# Patient Record
Sex: Male | Born: 1965
Health system: Southern US, Community
[De-identification: ages and names within clinical notes are randomized; demographics above are authoritative.]

## PROBLEM LIST (undated history)

## (undated) DIAGNOSIS — E785 Hyperlipidemia, unspecified: Secondary | ICD-10-CM

## (undated) HISTORY — DX: Hyperlipidemia, unspecified: E78.5

---

## 2003-02-21 HISTORY — PX: APPENDECTOMY: SHX54

## 2003-02-21 HISTORY — PX: HERNIA REPAIR: SHX51

## 2003-06-10 ENCOUNTER — Observation Stay (HOSPITAL_COMMUNITY): Admission: EM | Admit: 2003-06-10 | Discharge: 2003-06-10 | Payer: Self-pay | Admitting: Emergency Medicine

## 2003-08-28 ENCOUNTER — Ambulatory Visit (HOSPITAL_COMMUNITY): Admission: RE | Admit: 2003-08-28 | Discharge: 2003-08-28 | Payer: Self-pay | Admitting: General Surgery

## 2007-02-21 HISTORY — PX: WRIST FRACTURE SURGERY: SHX121

## 2007-10-31 ENCOUNTER — Ambulatory Visit (HOSPITAL_COMMUNITY): Admission: RE | Admit: 2007-10-31 | Discharge: 2007-10-31 | Payer: Self-pay | Admitting: Family Medicine

## 2008-01-30 ENCOUNTER — Ambulatory Visit (HOSPITAL_COMMUNITY): Admission: RE | Admit: 2008-01-30 | Discharge: 2008-01-30 | Payer: Self-pay | Admitting: Pulmonary Disease

## 2008-05-13 ENCOUNTER — Ambulatory Visit (HOSPITAL_BASED_OUTPATIENT_CLINIC_OR_DEPARTMENT_OTHER): Admission: RE | Admit: 2008-05-13 | Discharge: 2008-05-14 | Payer: Self-pay | Admitting: *Deleted

## 2010-06-02 LAB — POCT HEMOGLOBIN-HEMACUE: Hemoglobin: 15.3 g/dL (ref 13.0–17.0)

## 2010-07-05 NOTE — Op Note (Signed)
NAME:  Joel Wade, Joel Wade             ACCOUNT NO.:  [id number]   MEDICAL RECORD NO.:  [id number]          PATIENT TYPE:  AMB   LOCATION:  DSC                          FACILITY:  [facility]   PHYSICIAN:  Tennis Must Meyerdierks, M.D.DATE OF BIRTH:  07/30/1965   DATE OF PROCEDURE:  05/13/2008  DATE OF DISCHARGE:  05/14/2008                               OPERATIVE REPORT   PREOPERATIVE DIAGNOSIS:  Comminuted intraarticular fracture, right  distal radius.   POSTOPERATIVE DIAGNOSIS:  Comminuted intraarticular fracture, right  distal radius.   PROCEDURE:  Open reduction and internal fixation, right distal radius  fracture with freeze-dried cadaver bone graft.   SURGEON:  Lowell Bouton, MD   ANESTHESIA:  General.   OPERATIVE FINDINGS:  The patient had a severely comminuted distal radius  fracture with intraarticular component in 2 areas.  There was  significant dorsal comminution.   PROCEDURE:  Under general anesthesia with a tourniquet on the right arm,  the right hand was prepped and draped in the usual fashion and after  explaining the limb the tourniquet was inflated to 250 mmHg.  The index  and long fingers were placed in finger traps and 10 pounds of traction  was applied across the end of the table.  A longitudinal incision was  made over the volar aspect of the distal forearm in line with the FCR  tendon.  Sharp dissection was carried through the subcutaneous tissues  and bleeding points were coagulated.  Sharp dissection was carried down  to the FCR tendon sheath and this was incised longitudinally.  The  tendon was then retracted ulnarly and the floor of the sheath was  incised with the scissors.  Blunt dissection was then carried down to  the FPL tendon which was retracted ulnarly and the pronator quadratus.  The pronator quadratus was sharply incised off its radial attachment.  A  Freer elevator was used to identify the fracture and the fracture was  reduced with a  Therapist, nutritional.  X-rays showed a radial offset and  another reduction was performed.  Multiple reductions were performed  until the ulnar side of the radius was near anatomic.  This was held  temporarily with a 0.45 K-wire, placed in the radial side of the ay  distal radius into the ulnar fragment.  The styloid fragment was then  reduced and it was markedly comminuted.  A 0.45 K-wire was then placed  across the styloid fragment temporarily holding the distal fragment in  place.  A freeze-dried cadaver bone graft had been inserted prior to  pinning into the dorsally comminuted area.  After both pins were  inserted, x-rays showed good alignment and so a short narrow DVR plate  was applied to the volar aspect of the distal radius.  The sliding hole  was placed proximally first and the length adjusted.  Seven pins were  then placed distally using one threaded and the remaining non-threaded  pegs.  Two more screws were placed proximally and the fracture appeared  to be stable.  Both pins were removed that had been temporarily  inserted.  The traction had been  removed prior to application of the  plate.  X-ray showed good position of the fracture and no evidence of  the pegs in the joint.  The wound was irrigated copiously with saline.  The pronator quadratus was repaired with a 4-0 Vicryl.  A TLSO drain was  inserted for drainage.  Subcutaneous tissue was closed with 4-0 Vicryl,  the skin with a 3-0  subcuticular Prolene.  Steri-Strips were applied followed by sterile  dressings.  Marcaine 0.5% was placed in the skin edges for pain control.  The patient was placed in a volar wrist splint.  He tolerated the  procedure well and went to the recovery room, awake in stable and good  condition.      Lowell Bouton, M.D.  Electronically Signed     EMM/MEDQ  D:  05/14/2008  T:  05/14/2008  Job:  [id number]   cc:   Teola Bradley, M.D.

## 2010-07-08 NOTE — H&P (Signed)
NAME:  Joel Wade, Joel Wade NO.:  [id number]   MEDICAL RECORD NO.:  [id number]                  PATIENT TYPE:   LOCATION:                                       FACILITY:   PHYSICIAN:  Dalia Heading, M.D.               DATE OF BIRTH:  09/10/1965   DATE OF ADMISSION:  DATE OF DISCHARGE:                                HISTORY & PHYSICAL   CHIEF COMPLAINT:  Right inguinal hernia.   HISTORY OF PRESENT ILLNESS:  The patient is a 45 year old white male who  presents with a symptomatic right inguinal hernia.  It has been present for  some time, but it has been recently causing his discomfort.   PAST MEDICAL HISTORY:  Unremarkable.   PAST SURGICAL HISTORY:  Laparoscopic appendectomy on 06/09/2003.   CURRENT MEDICATIONS:  Unremarkable.   ALLERGIES:  NO KNOWN DRUG ALLERGIES.   REVIEW OF SYSTEMS:  Noncontributory.   PHYSICAL EXAMINATION:  GENERAL:  The patient is a well-developed, well-  nourished white male in no acute distress.  VITAL SIGNS:  He afebrile, and vital signs are stable.  LUNGS:  Clear to auscultation with equal breath sounds bilaterally.  HEART:  Regular rate and rhythm without S3, S4, or murmurs.  ABDOMEN:  Soft, nontender, nondistended.  A reducible right inguinal hernia  is noted.  No left inguinal hernia is noted.  Genitourinary examination is  within normal limits.   IMPRESSION:  Right inguinal hernia.   PLAN:  The patient is scheduled for right inguinal herniorrhaphy on  08/28/2003.  The risks and benefits of the procedure, including bleeding,  infection, and recurrence of the hernia were fully explained to the patient  who gave informed consent.     ___________________________________________                                         Dalia Heading, M.D.   MAJ/MEDQ  D:  08/20/2003  T:  08/20/2003  Job:  [id number]   cc:   [facility]

## 2010-07-08 NOTE — Op Note (Signed)
NAME:  PANTELIS, ELGERSMA                       ACCOUNT NO.:  [id number]   MEDICAL RECORD NO.:  [id number]                   PATIENT TYPE:  EMS   LOCATION:  ED                                   FACILITY:  [facility]   PHYSICIAN:  Dalia Heading, M.D.               DATE OF BIRTH:  10-23-1965   DATE OF PROCEDURE:  06/09/2003  DATE OF DISCHARGE:                                 OPERATIVE REPORT   PREOPERATIVE DIAGNOSIS:  Acute appendicitis.   POSTOPERATIVE DIAGNOSIS:  Acute appendicitis.   PROCEDURE:  Laparoscopic appendectomy.   SURGEON:  Dalia Heading, M.D.   ANESTHESIA:  General endotracheal.   INDICATIONS:  The patient is a 45 year old white male who presents with  right lower quadrant abdominal pain.  CT scan of the abdomen reveals acute  appendicitis.  The risks and benefits of the procedure including bleeding,  infection, and the possibility of an open procedure were full explained to  the patient, who gave informed consent.   PROCEDURE NOTE:  The patient was placed in the Trendelenburg position after  induction of general endotracheal anesthesia.  The abdomen was prepped and  draped using the usual sterile technique with Betadine.  Surgical site  confirmation was performed.   An supraumbilical incision was made down to the fascia.  A Veress needle was  introduced into the abdominal cavity and confirmation of placement was done  using the saline drop test.  The abdomen was then insufflated to 16 mmHg  pressure.  An 11-mm trocar was introduced into the abdominal cavity under  direct visualization without difficulty.  An additional 11-mm trocar was  placed in the suprapubic region and 5-mm trocars were placed in the left  lower quadrant region.  The appendix was visualized and noted to be acutely  inflamed. It had not perforated.  The mesoappendix was divided using the  harmonic scalpel.   A vascular Endo-GIA was placed across the base of the appendix and fired.  The appendix  was removed using and EndoCatch bag without difficulty.  The  appendiceal stump was inspected, and no abnormal bleeding was noted.  A  small amount of Surgicel was placed in the right lower gutter.  Of note, was  the fact that the patient does have an indirect right inguinal hernia.  All  fluid and air were then evacuated from the abdominal cavity prior to removal  of the trocars.   All wounds were irrigated with normal saline.  All wounds were injected with  0.5% Sensorcaine.  The supraumbilical fascia as well as suprapubic fascia  were reapproximated using an #0 Vicryl interrupted suture. All skin  incisions were closed using staples. Betadine ointment and dry sterile  dressing were applied.   All tape and needle counts were correct at the end of the procedure.  The  patient was extubated in the operating room and went back to recovery room  awake and stable  condition.   COMPLICATIONS:  None.   SPECIMENS:  Appendix.   BLOOD LOSS:  Minimal.      ___________________________________________                                            Dalia Heading, M.D.   MAJ/MEDQ  D:  06/09/2003  T:  06/10/2003  Job:  [id number]   cc:   Dalia Heading, M.D.  [address] [place]  [place] [address]  Fax: [phone]   [facility]

## 2010-07-08 NOTE — Op Note (Signed)
NAME:  Joel Wade, Joel Wade                       ACCOUNT NO.:  [id number]   MEDICAL RECORD NO.:  [id number]                   PATIENT TYPE:  AMB   LOCATION:  DAY                                  FACILITY:  [facility]   PHYSICIAN:  Dalia Heading, M.D.               DATE OF BIRTH:  08-08-1965   DATE OF PROCEDURE:  08/28/2003  DATE OF DISCHARGE:                                 OPERATIVE REPORT   PREOPERATIVE DIAGNOSIS:  Right inguinal hernia.   POSTOPERATIVE DIAGNOSIS:  Right inguinal hernia, indirect.   PROCEDURE:  Right inguinal herniorrhaphy.   SURGEON:  Dalia Heading, M.D.   ANESTHESIA:  General.   INDICATIONS:  The patient is a 45 year old white male who presents with a  symptomatic right inguinal hernia.  The risks and benefits of the procedure  including bleeding, infection, pain, and the possibility of recurrence of  the hernia were fully explained to the patient, who gave informed consent.   PROCEDURE NOTE:  Patient was placed in the supine position.  After general  anesthesia was administered, the right groin region was prepped and draped  using the usual sterile technique with Betadine.  Surgical site confirmation  was performed.   A transverse incision was made in the right groin region down to the  external oblique aponeurosis.  The aponeurosis was incised to the external  ring.  A Penrose drain was placed around the spermatic cord.  The vas  deferens was noted within the spermatic cord.  The ilioinguinal nerve was  identified and retracted inferiorly from the operative field.  A lipoma of  the cord was excised.  A high ligation was performed using a 3-0 silk tie.  An indirect hernia sac was found.  This was freed away from the spermatic  cord up to the peritoneal reflection.  A high ligation of the hernia sac was  performed using a 2-0 Novofil pursestring suture.  The excess hernia sac was  excised.  The remnant was then inverted and a medium-sized polypropylene  plug  was placed in this region.  A Onlay polypropylene mesh patch was then  placed along the floor of the inguinal canal and secured superiorly to the  conjoined tendon and inferiorly to the shelving edge of Poupart's ligament  using 2-0 Novofil interrupted sutures.  The internal ring was re-created  using a 2-0 Novofil interrupted suture.  The external oblique aponeurosis  was reapproximated using a 2-0 Vicryl running suture.  The subcutaneous  layer was reapproximated using a 3-0 Vicryl interrupted suture.  The skin  was closed using a 4-0 Vicryl subcuticular suture.  Sensorcaine 0.5% was  instilled in the surrounding wound and the wound was covered with collodion.   All tape and needle counts were correct at the end of the procedure.  The  patient was awakened and transferred to PACU in stable condition.   COMPLICATIONS:  None.   SPECIMENS:  None.  BLOOD LOSS:  Minimal.      ___________________________________________                                            Dalia Heading, M.D.   MAJ/MEDQ  D:  08/28/2003  T:  08/28/2003  Job:  [id number]   cc:   [facility]

## 2013-12-01 ENCOUNTER — Ambulatory Visit (INDEPENDENT_AMBULATORY_CARE_PROVIDER_SITE_OTHER): Payer: BC Managed Care – PPO | Admitting: Family Medicine

## 2013-12-01 ENCOUNTER — Ambulatory Visit: Payer: Self-pay | Admitting: Family Medicine

## 2013-12-01 ENCOUNTER — Encounter: Payer: Self-pay | Admitting: Family Medicine

## 2013-12-01 VITALS — BP 120/88 | HR 80 | Ht 69.0 in | Wt 206.0 lb

## 2013-12-01 DIAGNOSIS — Z209 Contact with and (suspected) exposure to unspecified communicable disease: Secondary | ICD-10-CM

## 2013-12-01 DIAGNOSIS — Z0000 Encounter for general adult medical examination without abnormal findings: Secondary | ICD-10-CM

## 2013-12-01 DIAGNOSIS — I4510 Unspecified right bundle-branch block: Secondary | ICD-10-CM

## 2013-12-01 LAB — POCT URINALYSIS DIPSTICK
Bilirubin, UA: NEGATIVE
Blood, UA: NEGATIVE
Glucose, UA: NEGATIVE
Ketones, UA: NEGATIVE
Leukocytes, UA: NEGATIVE
Nitrite, UA: NEGATIVE
Protein, UA: NEGATIVE
Spec Grav, UA: 1.015
Urobilinogen, UA: NEGATIVE
pH, UA: 5

## 2013-12-01 LAB — CBC WITH DIFFERENTIAL/PLATELET
Basophils Absolute: 0 10*3/uL (ref 0.0–0.1)
Basophils Relative: 0 % (ref 0–1)
Eosinophils Absolute: 0.1 10*3/uL (ref 0.0–0.7)
Eosinophils Relative: 3 % (ref 0–5)
HCT: 42.7 % (ref 39.0–52.0)
Hemoglobin: 14.7 g/dL (ref 13.0–17.0)
Lymphocytes Relative: 40 % (ref 12–46)
Lymphs Abs: 1.8 10*3/uL (ref 0.7–4.0)
MCH: 31.5 pg (ref 26.0–34.0)
MCHC: 34.4 g/dL (ref 30.0–36.0)
MCV: 91.6 fL (ref 78.0–100.0)
Monocytes Absolute: 0.4 10*3/uL (ref 0.1–1.0)
Monocytes Relative: 9 % (ref 3–12)
Neutro Abs: 2.2 10*3/uL (ref 1.7–7.7)
Neutrophils Relative %: 48 % (ref 43–77)
Platelets: 266 10*3/uL (ref 150–400)
RBC: 4.66 MIL/uL (ref 4.22–5.81)
RDW: 13.6 % (ref 11.5–15.5)
WBC: 4.5 10*3/uL (ref 4.0–10.5)

## 2013-12-01 LAB — LIPID PANEL
Cholesterol: 253 mg/dL — ABNORMAL HIGH (ref 0–200)
HDL: 42 mg/dL (ref >39)
LDL Cholesterol: 183 mg/dL — ABNORMAL HIGH (ref 0–99)
Total CHOL/HDL Ratio: 6 Ratio
Triglycerides: 140 mg/dL (ref <150)
VLDL: 28 mg/dL (ref 0–40)

## 2013-12-01 LAB — COMPREHENSIVE METABOLIC PANEL
ALT: 19 U/L (ref 0–53)
AST: 15 U/L (ref 0–37)
Albumin: 4.3 g/dL (ref 3.5–5.2)
Alkaline Phosphatase: 46 U/L (ref 39–117)
BUN: 17 mg/dL (ref 6–23)
CO2: 24 mEq/L (ref 19–32)
Calcium: 9.2 mg/dL (ref 8.4–10.5)
Chloride: 102 mEq/L (ref 96–112)
Creat: 0.9 mg/dL (ref 0.50–1.35)
Glucose, Bld: 102 mg/dL — ABNORMAL HIGH (ref 70–99)
Potassium: 4.5 mEq/L (ref 3.5–5.3)
Sodium: 134 mEq/L — ABNORMAL LOW (ref 135–145)
Total Bilirubin: 0.6 mg/dL (ref 0.2–1.2)
Total Protein: 7.2 g/dL (ref 6.0–8.3)

## 2013-12-01 NOTE — Progress Notes (Signed)
   Subjective:    Patient ID: Joel Wade, male    DOB: 1965-12-08, 48 y.o.   MRN: [id number]  HPI He is here for complete examination. He has no particular concerns or complaints. His past medical history is negative. Family history significant for father dying of an MI at age 25. He does not smoke and drinks socially. He is a 7 year monogamous relationship. He did get married within the last year. His immunizations were reviewed. He usually exercises twice per week. His work is going well.   Review of Systems  All other systems reviewed and are negative.      Objective:   Physical Exam BP 120/88  Pulse 80  Ht 5\' 9"  (1.753 m)  Wt 206 lb (93.441 kg)  BMI 30.41 kg/m2  SpO2 98%  General Appearance:    Alert, cooperative, no distress, appears stated age  Head:    Normocephalic, without obvious abnormality, atraumatic  Eyes:    PERRL, conjunctiva/corneas clear, EOM's intact, fundi    benign  Ears:    Normal TM's and external ear canals  Nose:   Nares normal, mucosa normal, no drainage or sinus   tenderness  Throat:   Lips, mucosa, and tongue normal; teeth and gums normal  Neck:   Supple, no lymphadenopathy;  thyroid:  no   enlargement/tenderness/nodules; no carotid   bruit or JVD  Back:    Spine nontender, no curvature, ROM normal, no CVA     tenderness  Lungs:     Clear to auscultation bilaterally without wheezes, rales or     ronchi; respirations unlabored  Chest Wall:    No tenderness or deformity   Heart:    Regular rate and rhythm, S1 and S2 normal, no murmur, rub   or gallop  Breast Exam:    No chest wall tenderness, masses or gynecomastia  Abdomen:     Soft, non-tender, nondistended, normoactive bowel sounds,    no masses, no hepatosplenomegaly  Genitalia:    Normal male external genitalia without lesions.  Testicles without masses.  No inguinal hernias.  Rectal:    Normal sphincter tone, no masses or tenderness; guaiac negative stool.  Prostate smooth, no nodules, not  enlarged.  Extremities:   No clubbing, cyanosis or edema  Pulses:   2+ and symmetric all extremities  Skin:   Skin color, texture, turgor normal, no rashes or lesions  Lymph nodes:   Cervical, supraclavicular, and axillary nodes normal  Neurologic:   CNII-XII intact, normal strength, sensation and gait; reflexes 2+ and symmetric throughout          Psych:   Normal mood, affect, hygiene and grooming.   EKG does show RBBB       Assessment & Plan:  Routine general medical examination at a health care facility - Plan: POCT Urinalysis Dipstick, CBC with Differential, Comprehensive metabolic panel, Lipid panel, EKG 12-Lead, Hepatitis B surface antibody, POCT occult blood stool  Contact with or exposure to communicable disease - Plan: HIV antibody, RPR  RBBB  I explained that the RBBB is not indicative of a major underlying cardiac condition. Do not feel further workup is needed. He is comfortable with this.

## 2013-12-02 ENCOUNTER — Encounter: Payer: Self-pay | Admitting: Family Medicine

## 2013-12-02 LAB — HEPATITIS B SURFACE ANTIBODY, QUANTITATIVE: Hepatitis B-Post: 1000 m[IU]/mL

## 2013-12-02 LAB — HIV ANTIBODY (ROUTINE TESTING W REFLEX): HIV 1&2 Ab, 4th Generation: NONREACTIVE

## 2013-12-02 LAB — RPR

## 2013-12-02 MED ORDER — ATORVASTATIN CALCIUM 20 MG PO TABS: 20.0000 mg | ORAL_TABLET | Freq: Every day | ORAL | Status: DC

## 2013-12-02 NOTE — Addendum Note (Signed)
Addended by: Denita Lung on: 12/02/2013 12:31 PM   Modules accepted: Orders

## 2013-12-05 ENCOUNTER — Encounter: Payer: Self-pay | Admitting: Family Medicine

## 2014-03-02 ENCOUNTER — Ambulatory Visit (HOSPITAL_COMMUNITY)
Admission: RE | Admit: 2014-03-02 | Discharge: 2014-03-02 | Disposition: A | Discharge status: Home / Self Care | Payer: BLUE CROSS/BLUE SHIELD | Source: Ambulatory Visit | Attending: Pulmonary Disease | Admitting: Pulmonary Disease

## 2014-03-02 ENCOUNTER — Other Ambulatory Visit (HOSPITAL_COMMUNITY): Payer: Self-pay | Admitting: Pulmonary Disease

## 2014-03-02 DIAGNOSIS — R05 Cough: Secondary | ICD-10-CM | POA: Insufficient documentation

## 2014-03-02 DIAGNOSIS — J9811 Atelectasis: Secondary | ICD-10-CM | POA: Insufficient documentation

## 2014-03-02 DIAGNOSIS — R0602 Shortness of breath: Secondary | ICD-10-CM

## 2014-03-02 IMAGING — CR DG CHEST 2V
2 series · 2 of 2 positions shown · non-contrast
Comparison: [DATE].

CLINICAL DATA: Cough and congestion .

EXAM:
CHEST  2 VIEW

[view not recorded (1 of 2)]
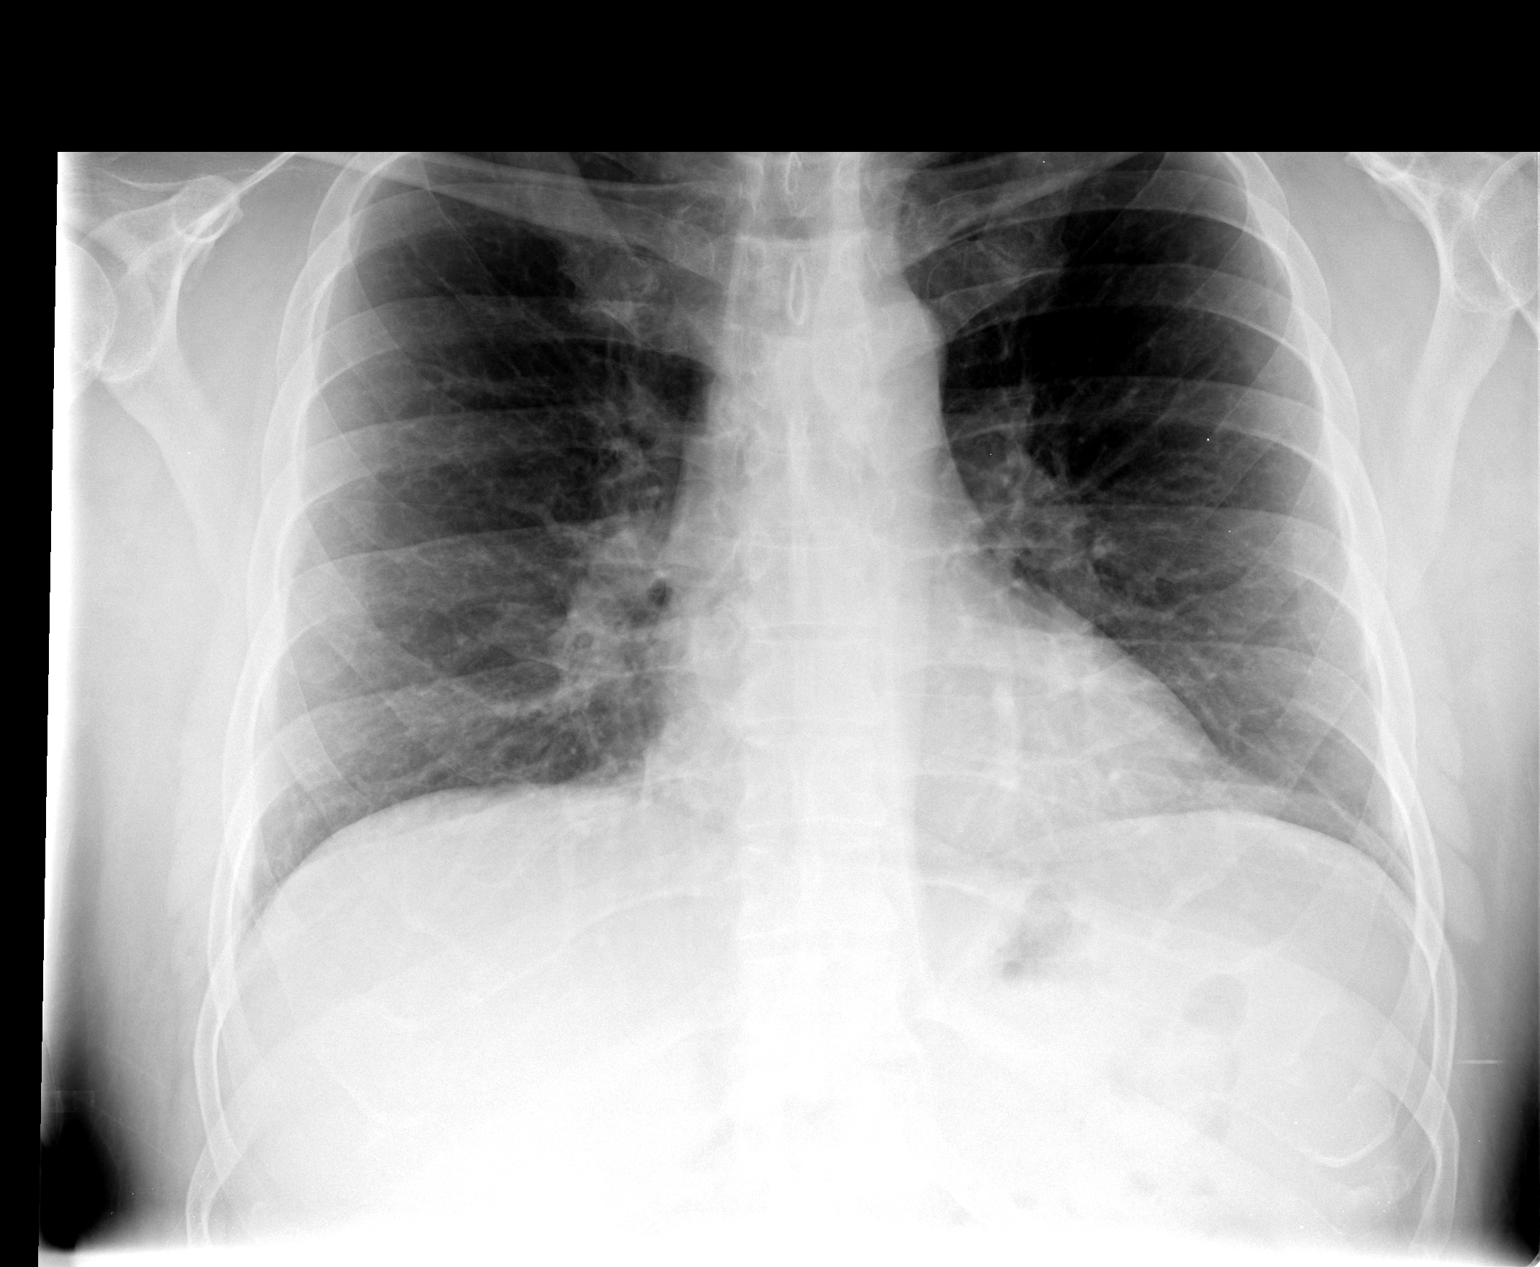

[view not recorded (2 of 2)]
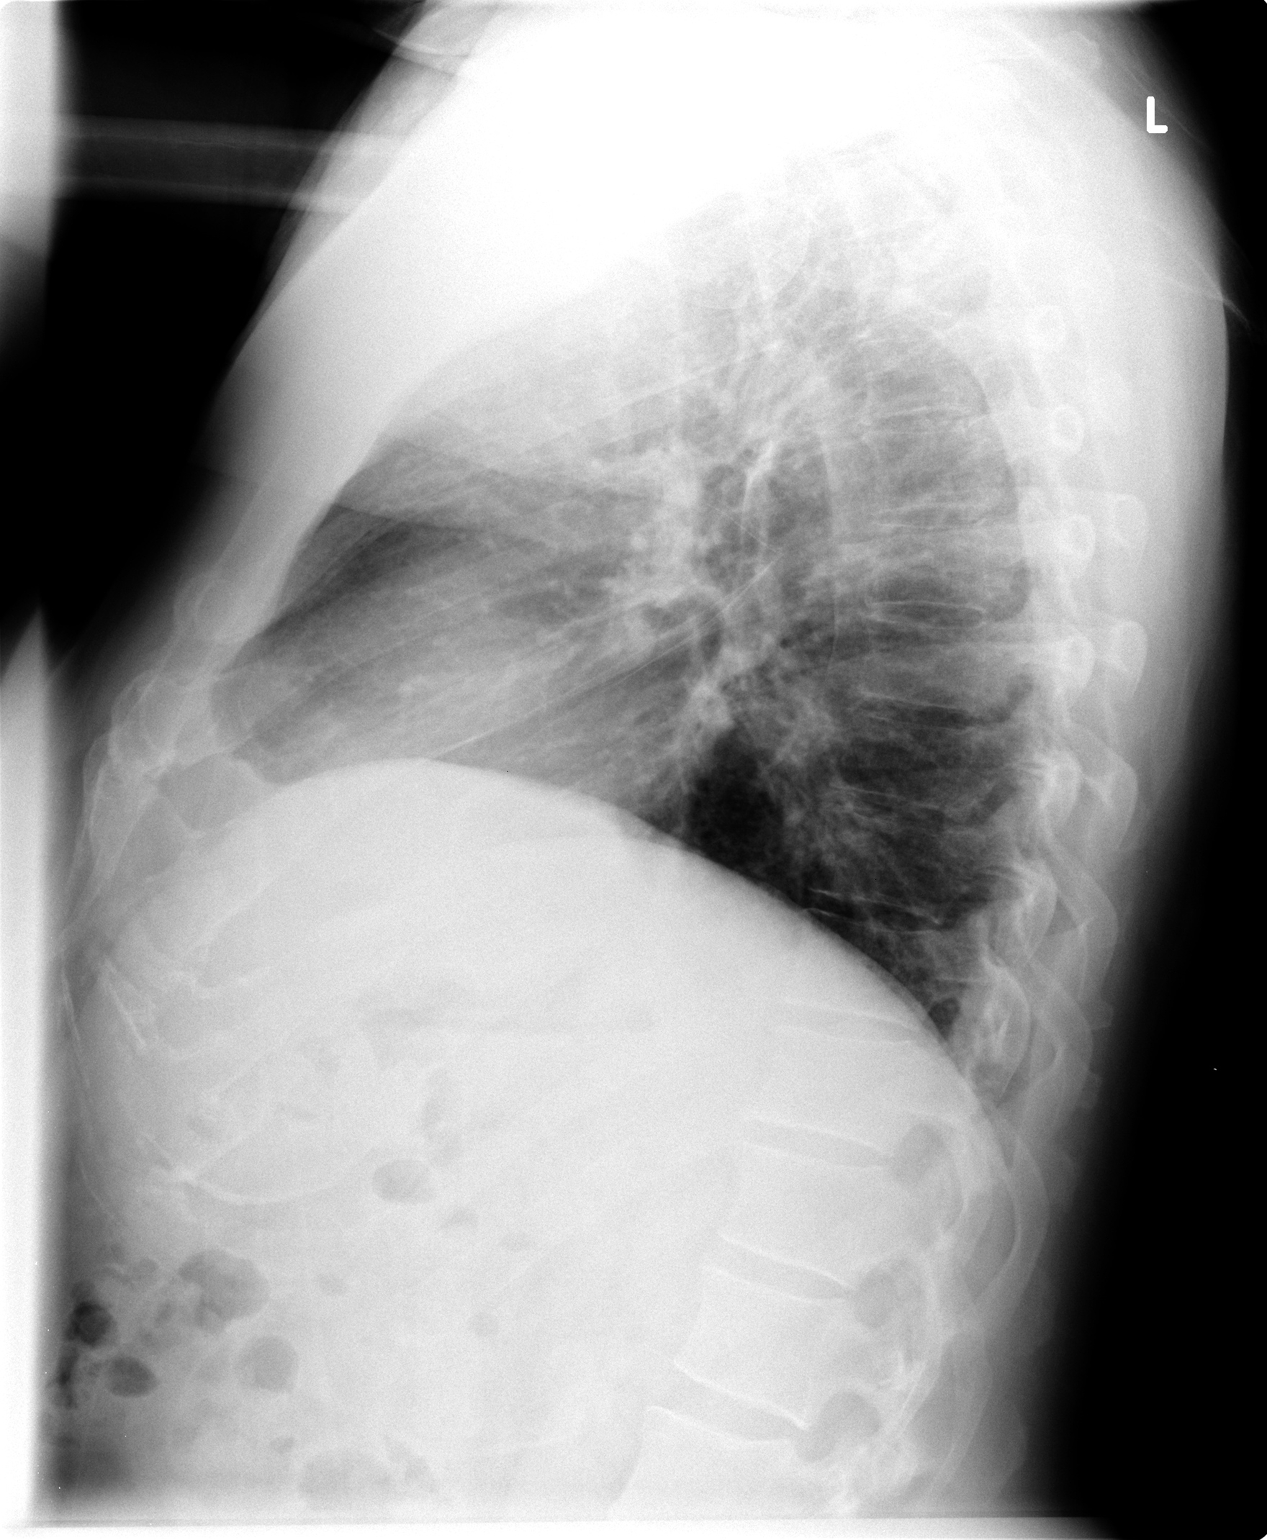

[2 of 2 positions shown; findings below may reference images not displayed]

FINDINGS: Mediastinum hilar structures are normal. Heart size normal.
Pulmonary vascularity normal. Mild subsegmental atelectasis both
lung bases. No pleural effusion or pneumothorax. No acute bony
abnormality identified. Degenerative changes thoracic spine.
IMPRESSION: Mild subsegmental atelectasis both lung bases, otherwise negative
chest.

## 2014-11-24 ENCOUNTER — Other Ambulatory Visit: Payer: Self-pay | Admitting: Family Medicine

## 2014-12-30 ENCOUNTER — Other Ambulatory Visit: Payer: Self-pay | Admitting: Family Medicine

## 2015-01-31 ENCOUNTER — Other Ambulatory Visit: Payer: Self-pay | Admitting: Medical

## 2015-02-01 ENCOUNTER — Telehealth: Payer: Self-pay | Admitting: Family Medicine

## 2015-02-01 NOTE — Telephone Encounter (Signed)
Dr. Ronnald Ramp has agreed to take pt on as a new patient.

## 2015-02-17 ENCOUNTER — Encounter: Payer: Self-pay | Admitting: Internal Medicine

## 2015-02-17 ENCOUNTER — Other Ambulatory Visit (INDEPENDENT_AMBULATORY_CARE_PROVIDER_SITE_OTHER): Payer: BLUE CROSS/BLUE SHIELD

## 2015-02-17 ENCOUNTER — Ambulatory Visit (INDEPENDENT_AMBULATORY_CARE_PROVIDER_SITE_OTHER): Payer: BLUE CROSS/BLUE SHIELD | Admitting: Internal Medicine

## 2015-02-17 VITALS — BP 130/96 | HR 91 | Temp 98.3°F | Resp 16 | Ht 69.0 in | Wt 214.0 lb

## 2015-02-17 DIAGNOSIS — R739 Hyperglycemia, unspecified: Secondary | ICD-10-CM | POA: Diagnosis not present

## 2015-02-17 DIAGNOSIS — R7303 Prediabetes: Secondary | ICD-10-CM | POA: Insufficient documentation

## 2015-02-17 DIAGNOSIS — E785 Hyperlipidemia, unspecified: Secondary | ICD-10-CM | POA: Diagnosis not present

## 2015-02-17 DIAGNOSIS — I10 Essential (primary) hypertension: Secondary | ICD-10-CM | POA: Diagnosis not present

## 2015-02-17 DIAGNOSIS — Z0000 Encounter for general adult medical examination without abnormal findings: Secondary | ICD-10-CM

## 2015-02-17 LAB — CBC WITH DIFFERENTIAL/PLATELET
Basophils Absolute: 0 10*3/uL (ref 0.0–0.1)
Basophils Relative: 0.4 % (ref 0.0–3.0)
Eosinophils Absolute: 0.3 10*3/uL (ref 0.0–0.7)
Eosinophils Relative: 3.3 % (ref 0.0–5.0)
HCT: 44.1 % (ref 39.0–52.0)
Hemoglobin: 14.6 g/dL (ref 13.0–17.0)
Lymphocytes Relative: 19.1 % (ref 12.0–46.0)
Lymphs Abs: 1.6 10*3/uL (ref 0.7–4.0)
MCHC: 33.1 g/dL (ref 30.0–36.0)
MCV: 94.3 fl (ref 78.0–100.0)
Monocytes Absolute: 0.6 10*3/uL (ref 0.1–1.0)
Monocytes Relative: 7.3 % (ref 3.0–12.0)
Neutro Abs: 5.9 10*3/uL (ref 1.4–7.7)
Neutrophils Relative %: 69.9 % (ref 43.0–77.0)
Platelets: 263 10*3/uL (ref 150.0–400.0)
RBC: 4.67 Mil/uL (ref 4.22–5.81)
RDW: 13 % (ref 11.5–15.5)
WBC: 8.5 10*3/uL (ref 4.0–10.5)

## 2015-02-17 LAB — URINALYSIS, ROUTINE W REFLEX MICROSCOPIC
Bilirubin Urine: NEGATIVE
Ketones, ur: NEGATIVE
Leukocytes, UA: NEGATIVE
Nitrite: NEGATIVE
Specific Gravity, Urine: <=1.005 — AB (ref 1.000–1.030)
Total Protein, Urine: NEGATIVE
Urine Glucose: NEGATIVE
Urobilinogen, UA: 0.2 (ref 0.0–1.0)
WBC, UA: NONE SEEN (ref 0–?)
pH: 6.5 (ref 5.0–8.0)

## 2015-02-17 LAB — COMPREHENSIVE METABOLIC PANEL
ALT: 18 U/L (ref 0–53)
AST: 14 U/L (ref 0–37)
Albumin: 4.4 g/dL (ref 3.5–5.2)
Alkaline Phosphatase: 66 U/L (ref 39–117)
BUN: 17 mg/dL (ref 6–23)
CO2: 26 mEq/L (ref 19–32)
Calcium: 9.5 mg/dL (ref 8.4–10.5)
Chloride: 103 mEq/L (ref 96–112)
Creatinine, Ser: 0.93 mg/dL (ref 0.40–1.50)
GFR: 91.5 mL/min (ref >60.00)
Glucose, Bld: 114 mg/dL — ABNORMAL HIGH (ref 70–99)
Potassium: 4.6 mEq/L (ref 3.5–5.1)
Sodium: 138 mEq/L (ref 135–145)
Total Bilirubin: 0.5 mg/dL (ref 0.2–1.2)
Total Protein: 7.7 g/dL (ref 6.0–8.3)

## 2015-02-17 LAB — LIPID PANEL
Cholesterol: 226 mg/dL — ABNORMAL HIGH (ref 0–200)
HDL: 49.4 mg/dL (ref >39.00)
LDL Cholesterol: 161 mg/dL — ABNORMAL HIGH (ref 0–99)
NonHDL: 176.85
Total CHOL/HDL Ratio: 5
Triglycerides: 77 mg/dL (ref 0.0–149.0)
VLDL: 15.4 mg/dL (ref 0.0–40.0)

## 2015-02-17 LAB — HEMOGLOBIN A1C: Hgb A1c MFr Bld: 6.1 % (ref 4.6–6.5)

## 2015-02-17 LAB — TSH: TSH: 2.3 u[IU]/mL (ref 0.35–4.50)

## 2015-02-17 LAB — PSA: PSA: 1.35 ng/mL (ref 0.10–4.00)

## 2015-02-17 NOTE — Patient Instructions (Signed)

## 2015-02-17 NOTE — Progress Notes (Signed)
Pre visit review using our clinic review tool, if applicable. No additional management support is needed unless otherwise documented below in the visit note. 

## 2015-02-17 NOTE — Progress Notes (Signed)
Subjective:  Patient ID: Joel Wade, male    DOB: 1965/03/06  Age: 49 y.o. MRN: [id number]  CC: Hypertension; Hyperlipidemia; and Annual Exam   HPI TEL CATTON presents for a complete physical. He has been under a lot of stress lately and about a week ago his face was red. His husband is a Marine scientist and took his blood pressure and said it was slightly elevated. He has felt well since then and has no ongoing symptoms.  History Rhyatt has a past medical history of Hyperlipidemia.   He has no past surgical history on file.   His family history includes Diabetes in his mother; Drug abuse in his brother; Early death in his brother; Heart disease in his father; Hyperlipidemia in his father; Hypertension in his father. There is no history of Cancer, Kidney disease, or Stroke.He reports that he has never smoked. He has never used smokeless tobacco. He reports that he drinks alcohol. He reports that he does not use illicit drugs.  Outpatient Prescriptions Prior to Visit  Medication Sig Dispense Refill  . atorvastatin (LIPITOR) 20 MG tablet TAKE 1 TABLET BY MOUTH EVERY DAY 30 tablet 0  . Multiple Vitamins-Minerals (MULTIVITAMIN WITH MINERALS) tablet Take 1 tablet by mouth daily.    . Omega-3 Fatty Acids (FISH OIL) 1000 MG CAPS Take by mouth. PT TAKES 3 GRAMS DAILY    . Lysine 500 MG TABS Take by mouth. PT TAKES ONE EYERY OTHER DAY    . vitamin C (ASCORBIC ACID) 500 MG tablet Take 500 mg by mouth daily. PT TAKES EVERY OTHER DAY     No facility-administered medications prior to visit.    ROS Review of Systems  Constitutional: Positive for unexpected weight change (some weight gain). Negative for fever, chills, diaphoresis, appetite change and fatigue.  HENT: Negative.   Eyes: Negative.   Respiratory: Negative.  Negative for apnea, cough, choking, chest tightness, shortness of breath, wheezing and stridor.   Cardiovascular: Negative.  Negative for chest pain, palpitations and leg  swelling.  Gastrointestinal: Negative.  Negative for nausea, vomiting, abdominal pain, diarrhea, constipation and blood in stool.  Endocrine: Negative.   Genitourinary: Negative.  Negative for dysuria, urgency, hematuria, discharge, scrotal swelling, difficulty urinating, penile pain and testicular pain.  Musculoskeletal: Negative.   Skin: Negative.   Allergic/Immunologic: Negative.   Neurological: Negative.  Negative for dizziness.  Hematological: Negative.  Negative for adenopathy. Does not bruise/bleed easily.  Psychiatric/Behavioral: Negative.     Objective:  BP 130/96 mmHg  Pulse 91  Temp(Src) 98.3 F (36.8 C) (Oral)  Resp 16  Ht 5\' 9"  (1.753 m)  Wt 214 lb (97.07 kg)  BMI 31.59 kg/m2  SpO2 94%  Physical Exam  Constitutional: He is oriented to person, place, and time. He appears well-developed and well-nourished. No distress.  HENT:  Mouth/Throat: Oropharynx is clear and moist. No oropharyngeal exudate.  Eyes: Conjunctivae are normal. Right eye exhibits no discharge. Left eye exhibits no discharge. No scleral icterus.  Neck: Normal range of motion. Neck supple. No JVD present. No tracheal deviation present. No thyromegaly present.  Cardiovascular: Normal rate, regular rhythm, normal heart sounds and intact distal pulses.  Exam reveals no gallop and no friction rub.   No murmur heard. EKG- normal sinus rhythm, right bundle branch block. Otherwise normal EKG. There is no LVH. EKG is unchanged compared to EKG done year ago.  Pulmonary/Chest: Effort normal and breath sounds normal. No stridor. No respiratory distress. He has no wheezes. He has  no rales. He exhibits no tenderness.  Abdominal: Soft. Bowel sounds are normal. He exhibits no distension and no mass. There is no tenderness. There is no rebound and no guarding. Hernia confirmed negative in the right inguinal area and confirmed negative in the left inguinal area.  Genitourinary: Rectum normal, prostate normal, testes normal  and penis normal. Rectal exam shows no external hemorrhoid, no internal hemorrhoid, no fissure, no mass, no tenderness and anal tone normal. Guaiac negative stool. Prostate is not enlarged and not tender. Right testis shows no mass, no swelling and no tenderness. Right testis is descended. Left testis shows no mass, no swelling and no tenderness. Left testis is descended. Circumcised. No penile erythema or penile tenderness. No discharge found.  Musculoskeletal: Normal range of motion. He exhibits no edema or tenderness.  Lymphadenopathy:    He has no cervical adenopathy.       Right: No inguinal adenopathy present.       Left: No inguinal adenopathy present.  Neurological: He is oriented to person, place, and time.  Skin: Skin is warm and dry. No rash noted. He is not diaphoretic. No erythema. No pallor.  Psychiatric: He has a normal mood and affect. His behavior is normal. Judgment and thought content normal.  Vitals reviewed.     Assessment & Plan:   Kristafer was seen today for hypertension, hyperlipidemia and annual exam.  Diagnoses and all orders for this visit:  Routine general medical examination at a health care facility- vaccines were reviewed and updated, exam done, labs ordered and reviewed, he will be sent for colonoscopy after he turns 60, patient education material was given. -     Lipid panel; Future -     TSH; Future -     Urinalysis, Routine w reflex microscopic (not at [facility]); Future -     PSA; Future -     Comprehensive metabolic panel; Future -     CBC with Differential/Platelet; Future  Hyperlipidemia with target LDL less than 130- his Framingham risk was only 6% so I do not recommend statin.  Essential hypertension- he has a borderline elevation in his blood pressure, his EKG shows no evidence of left ventricular hypertrophy. His exam and labs show no evidence of secondary metabolic causes or end organ damage. He will work on his lifestyle modifications and will  recheck his blood pressure in about 3-4 months. For now we have decided not to start antihypertensive therapy. -     EKG 12-Lead  Hyperglycemia- he has prediabetes and agrees to work on his lifestyle modifications. -     Hemoglobin A1c; Future   I have discontinued Mr. Mudry's vitamin C and Lysine. I am also having him maintain his Fish Oil, multivitamin with minerals, atorvastatin, and aspirin.  Meds ordered this encounter  Medications  . aspirin 81 MG tablet    Sig: Take 81 mg by mouth daily.     Follow-up: Return in about 6 months (around 08/18/2015).  Scarlette Calico, MD

## 2015-02-19 ENCOUNTER — Encounter: Payer: Self-pay | Admitting: Medical

## 2015-03-07 ENCOUNTER — Encounter: Payer: Self-pay | Admitting: Internal Medicine

## 2015-03-08 ENCOUNTER — Other Ambulatory Visit: Payer: Self-pay | Admitting: Medical

## 2015-03-08 MED ORDER — ATORVASTATIN CALCIUM 20 MG PO TABS: 20.0000 mg | ORAL_TABLET | Freq: Every day | ORAL | Status: DC

## 2015-05-30 ENCOUNTER — Other Ambulatory Visit: Payer: Self-pay | Admitting: Internal Medicine

## 2015-05-30 DIAGNOSIS — Z1211 Encounter for screening for malignant neoplasm of colon: Secondary | ICD-10-CM

## 2015-06-02 ENCOUNTER — Encounter: Payer: Self-pay | Admitting: Gastroenterology

## 2015-08-02 ENCOUNTER — Ambulatory Visit (AMBULATORY_SURGERY_CENTER): Payer: Self-pay

## 2015-08-02 VITALS — Ht 70.0 in | Wt 214.0 lb

## 2015-08-02 DIAGNOSIS — Z1211 Encounter for screening for malignant neoplasm of colon: Secondary | ICD-10-CM

## 2015-08-02 NOTE — Progress Notes (Signed)
No egg or soy allergy.  No previous complications from anesthesia. No home O2. No diet meds. Emmi video sent to [email or web address]

## 2015-08-03 ENCOUNTER — Encounter: Payer: Self-pay | Admitting: Gastroenterology

## 2015-08-16 ENCOUNTER — Ambulatory Visit (AMBULATORY_SURGERY_CENTER): Payer: BLUE CROSS/BLUE SHIELD | Admitting: Gastroenterology

## 2015-08-16 ENCOUNTER — Encounter: Payer: Self-pay | Admitting: Gastroenterology

## 2015-08-16 VITALS — BP 105/75 | HR 81 | Temp 98.2°F | Resp 10 | Ht 70.0 in | Wt 214.0 lb

## 2015-08-16 DIAGNOSIS — Z1211 Encounter for screening for malignant neoplasm of colon: Secondary | ICD-10-CM

## 2015-08-16 DIAGNOSIS — K635 Polyp of colon: Secondary | ICD-10-CM | POA: Diagnosis not present

## 2015-08-16 DIAGNOSIS — D120 Benign neoplasm of cecum: Secondary | ICD-10-CM | POA: Diagnosis not present

## 2015-08-16 LAB — HM COLONOSCOPY

## 2015-08-16 MED ORDER — SODIUM CHLORIDE 0.9 % IV SOLN: 500.0000 mL | INTRAVENOUS | Status: DC

## 2015-08-16 NOTE — Progress Notes (Signed)
Called to room to assist during endoscopic procedure.  Patient ID and intended procedure confirmed with present staff. Received instructions for my participation in the procedure from the performing physician.  

## 2015-08-16 NOTE — Progress Notes (Signed)
Stable to RR 

## 2015-08-16 NOTE — Patient Instructions (Signed)
YOU HAD AN ENDOSCOPIC PROCEDURE TODAY AT THE [facility]:   Refer to the procedure report that was given to you for any specific questions about what was found during the examination.  If the procedure report does not answer your questions, please call your gastroenterologist to clarify.  If you requested that your care partner not be given the details of your procedure findings, then the procedure report has been included in a sealed envelope for you to review at your convenience later.  YOU SHOULD EXPECT: Some feelings of bloating in the abdomen. Passage of more gas than usual.  Walking can help get rid of the air that was put into your GI tract during the procedure and reduce the bloating. If you had a lower endoscopy (such as a colonoscopy or flexible sigmoidoscopy) you may notice spotting of blood in your stool or on the toilet paper. If you underwent a bowel prep for your procedure, you may not have a normal bowel movement for a few days.  Please Note:  You might notice some irritation and congestion in your nose or some drainage.  This is from the oxygen used during your procedure.  There is no need for concern and it should clear up in a day or so.  SYMPTOMS TO REPORT IMMEDIATELY:   Following lower endoscopy (colonoscopy or flexible sigmoidoscopy):  Excessive amounts of blood in the stool  Significant tenderness or worsening of abdominal pains  Swelling of the abdomen that is new, acute  Fever of 100F or higher   For urgent or emergent issues, a gastroenterologist can be reached at any hour by calling [phone].   DIET: Your first meal following the procedure should be a small meal and then it is ok to progress to your normal diet. Heavy or fried foods are harder to digest and may make you feel nauseous or bloated.  Likewise, meals heavy in dairy and vegetables can increase bloating.  Drink plenty of fluids but you should avoid alcoholic beverages for 24  hours.  ACTIVITY:  You should plan to take it easy for the rest of today and you should NOT DRIVE or use heavy machinery until tomorrow (because of the sedation medicines used during the test).    FOLLOW UP: Our staff will call the number listed on your records the next business day following your procedure to check on you and address any questions or concerns that you may have regarding the information given to you following your procedure. If we do not reach you, we will leave a message.  However, if you are feeling well and you are not experiencing any problems, there is no need to return our call.  We will assume that you have returned to your regular daily activities without incident.  If any biopsies were taken you will be contacted by phone or by letter within the next 1-3 weeks.  Please call us at [phone] if you have not heard about the biopsies in 3 weeks.    SIGNATURES/CONFIDENTIALITY: You and/or your care partner have signed paperwork which will be entered into your electronic medical record.  These signatures attest to the fact that that the information above on your After Visit Summary has been reviewed and is understood.  Full responsibility of the confidentiality of this discharge information lies with you and/or your care-partner.  Polyp-handout given  Repeat colonoscopy will be determined by pathology.   

## 2015-08-16 NOTE — Op Note (Signed)
[place] Patient Name: Joel Wade Procedure Date: 08/16/2015 10:27 AM MRN: [id number] Endoscopist: Mallie Mussel L. Loletha Carrow , MD Age: 50 Referring MD:  Date of Birth: 07-Jul-1965 Gender: Male Account #: [id number] Procedure:                Colonoscopy Indications:              Screening for colorectal malignant neoplasm, This                            is the patient's first colonoscopy Medicines:                Monitored Anesthesia Care Procedure:                Pre-Anesthesia Assessment:                           - Prior to the procedure, a History and Physical                            was performed, and patient medications and                            allergies were reviewed. The patient's tolerance of                            previous anesthesia was also reviewed. The risks                            and benefits of the procedure and the sedation                            options and risks were discussed with the patient.                            All questions were answered, and informed consent                            was obtained. Prior Anticoagulants: The patient has                            taken aspirin, last dose was 1 day prior to                            procedure. ASA Grade Assessment: II - A patient                            with mild systemic disease. After reviewing the                            risks and benefits, the patient was deemed in                            satisfactory condition to undergo the procedure.  After obtaining informed consent, the colonoscope                            was passed under direct vision. Throughout the                            procedure, the patient's blood pressure, pulse, and                            oxygen saturations were monitored continuously. The                            Model CF-HQ190L [id number]) scope was introduced                            through the anus and  advanced to the the cecum,                            identified by appendiceal orifice and ileocecal                            valve. The colonoscopy was performed without                            difficulty. The patient tolerated the procedure                            well. The quality of the bowel preparation was                            excellent. The ileocecal valve, appendiceal                            orifice, and rectum were photographed. The bowel                            preparation used was Miralax. Scope In: 10:42:42 AM Scope Out: 10:57:24 AM Scope Withdrawal Time: 0 hours 12 minutes 43 seconds  Total Procedure Duration: 0 hours 14 minutes 42 seconds  Findings:                 The perianal and digital rectal examinations were                            normal.                           A 2 mm polyp was found in the cecum. The polyp was                            sessile. The polyp was removed with a cold biopsy                            forceps. Resection and retrieval were complete.  The exam was otherwise without abnormality on                            direct and retroflexion views. Complications:            No immediate complications. Estimated Blood Loss:     Estimated blood loss: none. Impression:               - One 2 mm polyp in the cecum, removed with a cold                            biopsy forceps. Resected and retrieved.                           - The examination was otherwise normal on direct                            and retroflexion views. Recommendation:           - Patient has a contact number available for                            emergencies. The signs and symptoms of potential                            delayed complications were discussed with the                            patient. Return to normal activities tomorrow.                            Written discharge instructions were provided to the                             patient.                           - Resume previous diet.                           - Continue present medications.                           - Await pathology results.                           - Repeat colonoscopy is recommended for                            surveillance. The colonoscopy date will be                            determined after pathology results from today's                            exam become available for review. Henry L. Loletha Carrow, MD 08/16/2015 11:03:01 AM This report has been  signed electronically.

## 2015-08-17 ENCOUNTER — Telehealth: Payer: Self-pay | Admitting: *Deleted

## 2015-08-17 NOTE — Telephone Encounter (Signed)
  Follow up Call-  Call back number 08/16/2015  Post procedure Call Back phone  # [phone]  Permission to leave phone message Yes     Patient questions:  Do you have a fever, pain , or abdominal swelling? No. Pain Score  0 *  Have you tolerated food without any problems? Yes.    Have you been able to return to your normal activities? Yes.    Do you have any questions about your discharge instructions: Diet   No. Medications  No. Follow up visit  No.  Do you have questions or concerns about your Care? No.  Actions: * If pain score is 4 or above: No action needed, pain <4.

## 2015-08-19 ENCOUNTER — Encounter: Payer: Self-pay | Admitting: Gastroenterology

## 2015-08-22 LAB — HM COLONOSCOPY

## 2015-11-22 DIAGNOSIS — Z23 Encounter for immunization: Secondary | ICD-10-CM | POA: Diagnosis not present

## 2015-12-08 ENCOUNTER — Other Ambulatory Visit: Payer: Self-pay | Admitting: Internal Medicine

## 2016-03-03 DIAGNOSIS — J309 Allergic rhinitis, unspecified: Secondary | ICD-10-CM | POA: Diagnosis not present

## 2016-06-26 DIAGNOSIS — J309 Allergic rhinitis, unspecified: Secondary | ICD-10-CM | POA: Diagnosis not present

## 2016-08-25 ENCOUNTER — Other Ambulatory Visit: Payer: Self-pay | Admitting: Internal Medicine

## 2016-08-25 NOTE — Telephone Encounter (Signed)
Pt last seen in 2016. Erx refused.   Can you call pt and see if they would like to be seen? If so, I can send in a short supply.

## 2016-08-25 NOTE — Telephone Encounter (Signed)
Pt said that he is going to check his work travel schedule and call back on Monday to make an appointment.

## 2016-09-27 DIAGNOSIS — R21 Rash and other nonspecific skin eruption: Secondary | ICD-10-CM | POA: Diagnosis not present

## 2016-11-27 ENCOUNTER — Encounter: Payer: Self-pay | Admitting: Internal Medicine

## 2016-12-05 ENCOUNTER — Other Ambulatory Visit (INDEPENDENT_AMBULATORY_CARE_PROVIDER_SITE_OTHER): Payer: BLUE CROSS/BLUE SHIELD

## 2016-12-05 ENCOUNTER — Encounter: Payer: Self-pay | Admitting: Internal Medicine

## 2016-12-05 ENCOUNTER — Telehealth: Payer: Self-pay

## 2016-12-05 ENCOUNTER — Ambulatory Visit (INDEPENDENT_AMBULATORY_CARE_PROVIDER_SITE_OTHER): Payer: BLUE CROSS/BLUE SHIELD | Admitting: Internal Medicine

## 2016-12-05 VITALS — BP 128/88 | HR 86 | Temp 98.3°F | Resp 16 | Ht 70.0 in | Wt 210.2 lb

## 2016-12-05 DIAGNOSIS — E785 Hyperlipidemia, unspecified: Secondary | ICD-10-CM

## 2016-12-05 DIAGNOSIS — Z0000 Encounter for general adult medical examination without abnormal findings: Secondary | ICD-10-CM | POA: Diagnosis not present

## 2016-12-05 DIAGNOSIS — R739 Hyperglycemia, unspecified: Secondary | ICD-10-CM | POA: Diagnosis not present

## 2016-12-05 DIAGNOSIS — I10 Essential (primary) hypertension: Secondary | ICD-10-CM | POA: Diagnosis not present

## 2016-12-05 LAB — COMPREHENSIVE METABOLIC PANEL
ALT: 18 U/L (ref 0–53)
AST: 15 U/L (ref 0–37)
Albumin: 4.4 g/dL (ref 3.5–5.2)
Alkaline Phosphatase: 52 U/L (ref 39–117)
BUN: 17 mg/dL (ref 6–23)
CO2: 28 mEq/L (ref 19–32)
Calcium: 9.4 mg/dL (ref 8.4–10.5)
Chloride: 102 mEq/L (ref 96–112)
Creatinine, Ser: 0.99 mg/dL (ref 0.40–1.50)
GFR: 84.52 mL/min (ref >60.00)
Glucose, Bld: 87 mg/dL (ref 70–99)
Potassium: 3.9 mEq/L (ref 3.5–5.1)
Sodium: 137 mEq/L (ref 135–145)
Total Bilirubin: 0.5 mg/dL (ref 0.2–1.2)
Total Protein: 7.3 g/dL (ref 6.0–8.3)

## 2016-12-05 LAB — CBC WITH DIFFERENTIAL/PLATELET
Basophils Absolute: 0 10*3/uL (ref 0.0–0.1)
Basophils Relative: 0.6 % (ref 0.0–3.0)
Eosinophils Absolute: 0.2 10*3/uL (ref 0.0–0.7)
Eosinophils Relative: 3.9 % (ref 0.0–5.0)
HCT: 44.1 % (ref 39.0–52.0)
Hemoglobin: 14.9 g/dL (ref 13.0–17.0)
Lymphocytes Relative: 34 % (ref 12.0–46.0)
Lymphs Abs: 1.9 10*3/uL (ref 0.7–4.0)
MCHC: 33.9 g/dL (ref 30.0–36.0)
MCV: 93.5 fl (ref 78.0–100.0)
Monocytes Absolute: 0.6 10*3/uL (ref 0.1–1.0)
Monocytes Relative: 10.4 % (ref 3.0–12.0)
Neutro Abs: 2.9 10*3/uL (ref 1.4–7.7)
Neutrophils Relative %: 51.1 % (ref 43.0–77.0)
Platelets: 273 10*3/uL (ref 150.0–400.0)
RBC: 4.71 Mil/uL (ref 4.22–5.81)
RDW: 13.5 % (ref 11.5–15.5)
WBC: 5.6 10*3/uL (ref 4.0–10.5)

## 2016-12-05 LAB — LIPID PANEL
Cholesterol: 270 mg/dL — ABNORMAL HIGH (ref 0–200)
HDL: 47.4 mg/dL (ref >39.00)
LDL Cholesterol: 183 mg/dL — ABNORMAL HIGH (ref 0–99)
NonHDL: 222.79
Total CHOL/HDL Ratio: 6
Triglycerides: 199 mg/dL — ABNORMAL HIGH (ref 0.0–149.0)
VLDL: 39.8 mg/dL (ref 0.0–40.0)

## 2016-12-05 LAB — HEMOGLOBIN A1C: Hgb A1c MFr Bld: 6 % (ref 4.6–6.5)

## 2016-12-05 LAB — PSA: PSA: 2.24 ng/mL (ref 0.10–4.00)

## 2016-12-05 LAB — THYROID PANEL WITH TSH
Free Thyroxine Index: 1.9 (ref 1.4–3.8)
T3 Uptake: 33 % (ref 22–35)
T4, Total: 5.7 ug/dL (ref 4.9–10.5)
TSH: 1.79 mIU/L (ref 0.40–4.50)

## 2016-12-05 MED ORDER — ZOSTER VAC RECOMB ADJUVANTED 50 MCG/0.5ML IM SUSR: 0.5000 mL | Freq: Once | INTRAMUSCULAR | 1 refills | Status: AC

## 2016-12-05 NOTE — Patient Instructions (Signed)

## 2016-12-05 NOTE — Telephone Encounter (Signed)
Patient has been added to shingrix waitlist 

## 2016-12-05 NOTE — Progress Notes (Signed)
Subjective:  Patient ID: Joel Wade, male    DOB: 13-Dec-1965  Age: 51 y.o. MRN: [id number]  CC: Annual Exam and Hyperlipidemia   HPI COLLYN RIBAS presents for a CPX - He feels well and offers no complaints. He has not taken the statin for several weeks. He wants to check his cholesterol level to see if he still needs to take it.  Outpatient Medications Prior to Visit  Medication Sig Dispense Refill  . aspirin 81 MG tablet Take 81 mg by mouth daily.    . cetirizine (ZYRTEC) 5 MG tablet Take 5 mg by mouth daily.    . Omega-3 Fatty Acids (FISH OIL) 1000 MG CAPS Take by mouth. PT TAKES 3 GRAMS DAILY    . atorvastatin (LIPITOR) 20 MG tablet TAKE 1 TABLET BY MOUTH EVERY DAY 90 tablet 2  . Multiple Vitamins-Minerals (MULTIVITAMIN WITH MINERALS) tablet Take 1 tablet by mouth daily.     No facility-administered medications prior to visit.     ROS Review of Systems  Constitutional: Negative for diaphoresis, fatigue and unexpected weight change.  HENT: Negative.   Eyes: Negative.   Respiratory: Negative.  Negative for cough, chest tightness, shortness of breath and wheezing.   Cardiovascular: Negative for chest pain, palpitations and leg swelling.  Gastrointestinal: Negative for abdominal pain, blood in stool, constipation, diarrhea, nausea and vomiting.  Endocrine: Negative.   Genitourinary: Negative.  Negative for difficulty urinating, scrotal swelling and testicular pain.  Musculoskeletal: Negative.  Negative for arthralgias and myalgias.  Skin: Negative.  Negative for color change and rash.  Allergic/Immunologic: Negative.   Neurological: Negative.  Negative for dizziness.  Hematological: Negative for adenopathy. Does not bruise/bleed easily.  Psychiatric/Behavioral: Negative.     Objective:  BP 128/88 (BP Location: Left Arm, Patient Position: Sitting, Cuff Size: Normal)   Pulse 86   Temp 98.3 F (36.8 C) (Oral)   Resp 16   Ht 5\' 10"  (1.778 m)   Wt 210 lb 4 oz  (95.4 kg)   SpO2 98%   BMI 30.17 kg/m   BP Readings from Last 3 Encounters:  12/05/16 128/88  08/16/15 105/75  02/17/15 (!) 130/96    Wt Readings from Last 3 Encounters:  12/05/16 210 lb 4 oz (95.4 kg)  08/16/15 214 lb (97.1 kg)  08/02/15 214 lb (97.1 kg)    Physical Exam  Constitutional: He is oriented to person, place, and time. No distress.  HENT:  Mouth/Throat: Oropharynx is clear and moist. No oropharyngeal exudate.  Eyes: Conjunctivae are normal. Right eye exhibits no discharge. Left eye exhibits no discharge. No scleral icterus.  Neck: Normal range of motion. Neck supple. No JVD present. No thyromegaly present.  Cardiovascular: Normal rate, regular rhythm and intact distal pulses.  Exam reveals no gallop and no friction rub.   No murmur heard. Pulmonary/Chest: Effort normal and breath sounds normal. No respiratory distress. He has no wheezes. He has no rales. He exhibits no tenderness.  Abdominal: Soft. Bowel sounds are normal. He exhibits no distension and no mass. There is no tenderness. There is no rebound and no guarding. Hernia confirmed negative in the right inguinal area and confirmed negative in the left inguinal area.  Genitourinary: Rectum normal, prostate normal, testes normal and penis normal. Rectal exam shows no external hemorrhoid, no internal hemorrhoid, no fissure, no mass, no tenderness, anal tone normal and guaiac negative stool. Prostate is not enlarged and not tender. Right testis shows no mass, no swelling and no tenderness. Right  testis is descended. Left testis shows no mass, no swelling and no tenderness. Left testis is descended. Circumcised. No penile erythema or penile tenderness. No discharge found.  Musculoskeletal: Normal range of motion. He exhibits no edema, tenderness or deformity.  Lymphadenopathy:    He has no cervical adenopathy.       Right: No inguinal adenopathy present.       Left: No inguinal adenopathy present.  Neurological: He is  alert and oriented to person, place, and time.  Skin: Skin is warm and dry. No rash noted. He is not diaphoretic. No erythema. No pallor.  Psychiatric: He has a normal mood and affect. His behavior is normal. Judgment and thought content normal.  Vitals reviewed.   Lab Results  Component Value Date   WBC 5.6 12/05/2016   HGB 14.9 12/05/2016   HCT 44.1 12/05/2016   PLT 273.0 12/05/2016   GLUCOSE 87 12/05/2016   CHOL 270 (H) 12/05/2016   TRIG 199.0 (H) 12/05/2016   HDL 47.40 12/05/2016   LDLCALC 183 (H) 12/05/2016   ALT 18 12/05/2016   AST 15 12/05/2016   NA 137 12/05/2016   K 3.9 12/05/2016   CL 102 12/05/2016   CREATININE 0.99 12/05/2016   BUN 17 12/05/2016   CO2 28 12/05/2016   TSH 1.79 12/05/2016   PSA 2.24 12/05/2016   HGBA1C 6.0 12/05/2016    Dg Chest 2 View  Result Date: 03/03/2014 CLINICAL DATA:  Cough and congestion . EXAM: CHEST  2 VIEW COMPARISON:  10/31/2007. FINDINGS: Mediastinum hilar structures are normal. Heart size normal. Pulmonary vascularity normal. Mild subsegmental atelectasis both lung bases. No pleural effusion or pneumothorax. No acute bony abnormality identified. Degenerative changes thoracic spine. IMPRESSION: Mild subsegmental atelectasis both lung bases, otherwise negative chest. Electronically Signed   By: [place]   On: 03/03/2014 09:27    Assessment & Plan:   Zyere was seen today for annual exam and hyperlipidemia.  Diagnoses and all orders for this visit:  Essential hypertension- his blood pressure is well-controlled on no medications. Labs are negative for any secondary causes or end organ damage. He will continue with lifestyle modifications. -     Comprehensive metabolic panel; Future -     CBC with Differential/Platelet; Future -     Thyroid Panel With TSH; Future  Hyperglycemia- he has mild prediabetes. Medical therapy is not indicated. -     Hemoglobin A1c; Future  Hyperlipidemia with target LDL less than 130- his ASCVD  risk score is not elevated so I do not recommend that he restart taking a statin. -     Thyroid Panel With TSH; Future  Routine general medical examination at a health care facility- exam completed, labs reviewed, he will get a flu vaccine at his place of employment, colon cancer screening is up-to-date, patient education material was given. -     Lipid panel; Future -     PSA; Future -     Zoster Vaccine Adjuvanted [facility]) injection; Inject 0.5 mLs into the muscle once.   I have discontinued Mr. York's multivitamin with minerals and atorvastatin. I am also having him start on Zoster Vaccine Adjuvanted. Additionally, I am having him maintain his Fish Oil, aspirin, and cetirizine.  Meds ordered this encounter  Medications  . Zoster Vaccine Adjuvanted [facility]) injection    Sig: Inject 0.5 mLs into the muscle once.    Dispense:  0.5 mL    Refill:  1     Follow-up: Return in about  1 year (around 12/05/2017).  Scarlette Calico, MD

## 2016-12-06 ENCOUNTER — Encounter: Payer: Self-pay | Admitting: Internal Medicine

## 2016-12-19 NOTE — Telephone Encounter (Signed)
Appt scheduled for 11/7.

## 2016-12-19 NOTE — Telephone Encounter (Signed)
Both vaccines have been labeled and placed in refrig 

## 2016-12-19 NOTE — Telephone Encounter (Signed)
Left message asking patient to call back to schedule nurse visit to get first shingrix vaccine---or if patient has changed his mind, call and let me know to take him off waitlist---let tamara know if patient schedules appt so that both vaccines can be labeled and placed in refrig

## 2016-12-27 ENCOUNTER — Ambulatory Visit (INDEPENDENT_AMBULATORY_CARE_PROVIDER_SITE_OTHER): Payer: BLUE CROSS/BLUE SHIELD

## 2016-12-27 DIAGNOSIS — Z299 Encounter for prophylactic measures, unspecified: Secondary | ICD-10-CM

## 2017-03-26 ENCOUNTER — Ambulatory Visit: Payer: BLUE CROSS/BLUE SHIELD | Admitting: Internal Medicine

## 2017-03-26 ENCOUNTER — Ambulatory Visit (INDEPENDENT_AMBULATORY_CARE_PROVIDER_SITE_OTHER): Payer: BLUE CROSS/BLUE SHIELD

## 2017-03-26 DIAGNOSIS — Z299 Encounter for prophylactic measures, unspecified: Secondary | ICD-10-CM

## 2017-04-17 ENCOUNTER — Other Ambulatory Visit: Payer: Self-pay | Admitting: Internal Medicine

## 2017-04-17 ENCOUNTER — Telehealth: Payer: Self-pay | Admitting: Internal Medicine

## 2017-04-17 DIAGNOSIS — R972 Elevated prostate specific antigen [PSA]: Secondary | ICD-10-CM

## 2017-04-17 NOTE — Telephone Encounter (Signed)
pls ask him to come to the lab to recheck his prostate blood level

## 2017-04-17 NOTE — Telephone Encounter (Signed)
-----   Message from Janith Lima, MD sent at 12/06/2016  7:53 AM EDT ----- Regarding: PSA recheck

## 2017-04-17 NOTE — Telephone Encounter (Signed)
Left message asking patient to come for lab recheck at his conenience, any further questions, can call our office

## 2017-05-30 ENCOUNTER — Other Ambulatory Visit: Payer: Self-pay | Admitting: Internal Medicine

## 2017-05-30 ENCOUNTER — Telehealth: Payer: Self-pay | Admitting: Internal Medicine

## 2017-05-30 DIAGNOSIS — R972 Elevated prostate specific antigen [PSA]: Secondary | ICD-10-CM

## 2017-05-30 NOTE — Telephone Encounter (Signed)
Contacted pt and informed that PCP needs to have PSA redrawn. Pt stated understanding and will go to the lab tomorrow.

## 2017-06-07 ENCOUNTER — Other Ambulatory Visit: Payer: BLUE CROSS/BLUE SHIELD

## 2017-06-07 DIAGNOSIS — R972 Elevated prostate specific antigen [PSA]: Secondary | ICD-10-CM | POA: Diagnosis not present

## 2017-06-08 LAB — PSA, TOTAL AND FREE
PSA, % Free: 29 % (calc) (ref >25)
PSA, Free: 0.5 ng/mL
PSA, Total: 1.7 ng/mL (ref <=4.0)

## 2017-06-10 ENCOUNTER — Encounter: Payer: Self-pay | Admitting: Internal Medicine

## 2017-06-10 LAB — PSA: PSA: 1.7

## 2017-06-20 DIAGNOSIS — Z23 Encounter for immunization: Secondary | ICD-10-CM | POA: Diagnosis not present

## 2018-11-21 DIAGNOSIS — Z23 Encounter for immunization: Secondary | ICD-10-CM | POA: Diagnosis not present

## 2018-12-23 DIAGNOSIS — Z20828 Contact with and (suspected) exposure to other viral communicable diseases: Secondary | ICD-10-CM | POA: Diagnosis not present

## 2018-12-24 ENCOUNTER — Ambulatory Visit (INDEPENDENT_AMBULATORY_CARE_PROVIDER_SITE_OTHER): Payer: BC Managed Care – PPO | Admitting: Internal Medicine

## 2018-12-24 ENCOUNTER — Encounter: Payer: Self-pay | Admitting: Internal Medicine

## 2018-12-24 ENCOUNTER — Other Ambulatory Visit: Payer: Self-pay

## 2018-12-24 ENCOUNTER — Other Ambulatory Visit (INDEPENDENT_AMBULATORY_CARE_PROVIDER_SITE_OTHER): Payer: BC Managed Care – PPO

## 2018-12-24 VITALS — BP 134/94 | HR 95 | Temp 98.0°F | Resp 16 | Ht 70.0 in | Wt 212.5 lb

## 2018-12-24 DIAGNOSIS — R739 Hyperglycemia, unspecified: Secondary | ICD-10-CM | POA: Diagnosis not present

## 2018-12-24 DIAGNOSIS — R9431 Abnormal electrocardiogram [ECG] [EKG]: Secondary | ICD-10-CM

## 2018-12-24 DIAGNOSIS — R0789 Other chest pain: Secondary | ICD-10-CM | POA: Diagnosis not present

## 2018-12-24 DIAGNOSIS — Z0000 Encounter for general adult medical examination without abnormal findings: Secondary | ICD-10-CM

## 2018-12-24 DIAGNOSIS — I10 Essential (primary) hypertension: Secondary | ICD-10-CM

## 2018-12-24 DIAGNOSIS — Z125 Encounter for screening for malignant neoplasm of prostate: Secondary | ICD-10-CM

## 2018-12-24 DIAGNOSIS — E785 Hyperlipidemia, unspecified: Secondary | ICD-10-CM

## 2018-12-24 LAB — BASIC METABOLIC PANEL
BUN: 16 mg/dL (ref 6–23)
CO2: 29 mEq/L (ref 19–32)
Calcium: 9.6 mg/dL (ref 8.4–10.5)
Chloride: 101 mEq/L (ref 96–112)
Creatinine, Ser: 0.98 mg/dL (ref 0.40–1.50)
GFR: 79.82 mL/min (ref >60.00)
Glucose, Bld: 110 mg/dL — ABNORMAL HIGH (ref 70–99)
Potassium: 4.3 mEq/L (ref 3.5–5.1)
Sodium: 136 mEq/L (ref 135–145)

## 2018-12-24 LAB — LIPID PANEL
Cholesterol: 305 mg/dL — ABNORMAL HIGH (ref 0–200)
HDL: 41.1 mg/dL (ref >39.00)
NonHDL: 263.59
Total CHOL/HDL Ratio: 7
Triglycerides: 253 mg/dL — ABNORMAL HIGH (ref 0.0–149.0)
VLDL: 50.6 mg/dL — ABNORMAL HIGH (ref 0.0–40.0)

## 2018-12-24 LAB — CBC WITH DIFFERENTIAL/PLATELET
Basophils Absolute: 0.1 10*3/uL (ref 0.0–0.1)
Basophils Relative: 0.7 % (ref 0.0–3.0)
Eosinophils Absolute: 0.2 10*3/uL (ref 0.0–0.7)
Eosinophils Relative: 2.6 % (ref 0.0–5.0)
HCT: 45 % (ref 39.0–52.0)
Hemoglobin: 15.1 g/dL (ref 13.0–17.0)
Lymphocytes Relative: 32.1 % (ref 12.0–46.0)
Lymphs Abs: 2.3 10*3/uL (ref 0.7–4.0)
MCHC: 33.5 g/dL (ref 30.0–36.0)
MCV: 93.5 fl (ref 78.0–100.0)
Monocytes Absolute: 0.7 10*3/uL (ref 0.1–1.0)
Monocytes Relative: 9.4 % (ref 3.0–12.0)
Neutro Abs: 3.9 10*3/uL (ref 1.4–7.7)
Neutrophils Relative %: 55.2 % (ref 43.0–77.0)
Platelets: 284 10*3/uL (ref 150.0–400.0)
RBC: 4.81 Mil/uL (ref 4.22–5.81)
RDW: 13.1 % (ref 11.5–15.5)
WBC: 7.1 10*3/uL (ref 4.0–10.5)

## 2018-12-24 LAB — URINALYSIS, ROUTINE W REFLEX MICROSCOPIC
Bilirubin Urine: NEGATIVE
Hgb urine dipstick: NEGATIVE
Ketones, ur: NEGATIVE
Leukocytes,Ua: NEGATIVE
Nitrite: NEGATIVE
Specific Gravity, Urine: 1.01 (ref 1.000–1.030)
Total Protein, Urine: NEGATIVE
Urine Glucose: NEGATIVE
Urobilinogen, UA: 0.2 (ref 0.0–1.0)
WBC, UA: NONE SEEN (ref 0–?)
pH: 6 (ref 5.0–8.0)

## 2018-12-24 LAB — TSH: TSH: 2.13 u[IU]/mL (ref 0.35–4.50)

## 2018-12-24 LAB — HEPATIC FUNCTION PANEL
ALT: 19 U/L (ref 0–53)
AST: 14 U/L (ref 0–37)
Albumin: 4.6 g/dL (ref 3.5–5.2)
Alkaline Phosphatase: 64 U/L (ref 39–117)
Bilirubin, Direct: 0.1 mg/dL (ref 0.0–0.3)
Total Bilirubin: 0.5 mg/dL (ref 0.2–1.2)
Total Protein: 7.4 g/dL (ref 6.0–8.3)

## 2018-12-24 LAB — PSA: PSA: 1.43 ng/mL (ref 0.10–4.00)

## 2018-12-24 LAB — LDL CHOLESTEROL, DIRECT: Direct LDL: 218 mg/dL

## 2018-12-24 MED ORDER — EDARBI 40 MG PO TABS: 1.0000 | ORAL_TABLET | Freq: Every day | ORAL | 0 refills | Status: DC

## 2018-12-24 NOTE — Patient Instructions (Signed)

## 2018-12-24 NOTE — Progress Notes (Signed)
Subjective:  Patient ID: Joel Wade, male    DOB: [birth year]  Age: 53 y.o. MRN: [id number]  CC: Annual Exam, Hypertension, and Hyperlipidemia   HPI Joel Wade presents for a CPX.  He complains of a 1 month history of chest tightness when he is in the bed and DOE during the day.  His father had a fatal MI in his 61s.  Outpatient Medications Prior to Visit  Medication Sig Dispense Refill  . cetirizine (ZYRTEC) 5 MG tablet Take 5 mg by mouth daily.    Marland Kitchen aspirin 81 MG tablet Take 81 mg by mouth daily.    . Omega-3 Fatty Acids (FISH OIL) 1000 MG CAPS Take by mouth. PT TAKES 3 GRAMS DAILY     No facility-administered medications prior to visit.     ROS Review of Systems  Constitutional: Negative for diaphoresis, fatigue and unexpected weight change.  HENT: Negative.   Eyes: Negative for visual disturbance.  Respiratory: Positive for chest tightness and shortness of breath. Negative for cough, wheezing and stridor.   Cardiovascular: Positive for chest pain.  Gastrointestinal: Negative for abdominal pain, constipation, diarrhea, nausea and vomiting.  Endocrine: Negative.   Genitourinary: Negative.  Negative for difficulty urinating, dysuria, hematuria, scrotal swelling and testicular pain.  Musculoskeletal: Negative.  Negative for arthralgias, back pain, myalgias and neck pain.  Skin: Negative.  Negative for color change, pallor and rash.  Neurological: Negative.  Negative for dizziness, weakness, numbness and headaches.  Hematological: Negative for adenopathy. Does not bruise/bleed easily.  Psychiatric/Behavioral: Negative.     Objective:  BP (!) 134/94 (BP Location: Left Arm, Patient Position: Sitting, Cuff Size: Large)   Pulse 95   Temp 98 F (36.7 C) (Oral)   Resp 16   Ht 5\' 10"  (1.778 m)   Wt 212 lb 8 oz (96.4 kg)   SpO2 98%   BMI 30.49 kg/m   BP Readings from Last 3 Encounters:  12/24/18 (!) 134/94  12/05/16 128/88  08/16/15 105/75    Wt Readings  from Last 3 Encounters:  12/24/18 212 lb 8 oz (96.4 kg)  12/05/16 210 lb 4 oz (95.4 kg)  08/16/15 214 lb (97.1 kg)    Physical Exam Vitals signs reviewed.  Constitutional:      General: He is not in acute distress.    Appearance: Normal appearance. He is not ill-appearing, toxic-appearing or diaphoretic.  HENT:     Nose: Nose normal.     Mouth/Throat:     Mouth: Mucous membranes are moist.  Eyes:     Conjunctiva/sclera: Conjunctivae normal.  Neck:     Musculoskeletal: Neck supple.  Cardiovascular:     Rate and Rhythm: Normal rate and regular rhythm.     Heart sounds: No murmur. No gallop.      Comments: EKG ---- Sinus  Rhythm  -Right bundle branch block.   ABNORMAL - no change from the prior EKG  Pulmonary:     Effort: Pulmonary effort is normal.     Breath sounds: No stridor. No wheezing, rhonchi or rales.  Abdominal:     General: Abdomen is flat. Bowel sounds are normal. There is no distension.     Palpations: Abdomen is soft. There is no hepatomegaly, splenomegaly or mass.     Tenderness: There is no abdominal tenderness.     Hernia: No hernia is present. There is no hernia in the left inguinal area or right inguinal area.  Genitourinary:    Pubic Area: No rash.  Penis: Normal. No discharge, swelling or lesions.      Scrotum/Testes: Normal.        Right: Mass, tenderness or swelling not present.        Left: Mass, tenderness or swelling not present.     Epididymis:     Right: Normal. Not enlarged. No mass.     Left: Normal. Not enlarged. No mass.  Musculoskeletal:     Right lower leg: No edema.     Left lower leg: No edema.  Lymphadenopathy:     Cervical: No cervical adenopathy.     Lower Body: No right inguinal adenopathy. No left inguinal adenopathy.  Skin:    General: Skin is warm and dry.     Coloration: Skin is not pale.     Findings: No rash.  Neurological:     General: No focal deficit present.     Mental Status: He is alert and oriented to  person, place, and time. Mental status is at baseline.  Psychiatric:        Mood and Affect: Mood normal.        Behavior: Behavior normal.     Lab Results  Component Value Date   WBC 7.1 12/24/2018   HGB 15.1 12/24/2018   HCT 45.0 12/24/2018   PLT 284.0 12/24/2018   GLUCOSE 110 (H) 12/24/2018   CHOL 305 (H) 12/24/2018   TRIG 253.0 (H) 12/24/2018   HDL 41.10 12/24/2018   LDLDIRECT 218.0 12/24/2018   LDLCALC 183 (H) 12/05/2016   ALT 19 12/24/2018   AST 14 12/24/2018   NA 136 12/24/2018   K 4.3 12/24/2018   CL 101 12/24/2018   CREATININE 0.98 12/24/2018   BUN 16 12/24/2018   CO2 29 12/24/2018   TSH 2.13 12/24/2018   PSA 1.43 12/24/2018   HGBA1C 6.1 12/24/2018    Dg Chest 2 View  Result Date: 03/03/2014 CLINICAL DATA:  Cough and congestion . EXAM: CHEST  2 VIEW COMPARISON:  10/31/2007. FINDINGS: Mediastinum hilar structures are normal. Heart size normal. Pulmonary vascularity normal. Mild subsegmental atelectasis both lung bases. No pleural effusion or pneumothorax. No acute bony abnormality identified. Degenerative changes thoracic spine. IMPRESSION: Mild subsegmental atelectasis both lung bases, otherwise negative chest. Electronically Signed   By: [place]   On: 03/03/2014 09:27    Assessment & Plan:   Illya was seen today for annual exam, hypertension and hyperlipidemia.  Diagnoses and all orders for this visit:  Essential hypertension- He has developed stage I hypertension.  His labs are negative for secondary causes or endorgan damage.  In addition to lifestyle modifications I have asked him to start taking an ARB to treat this. -     CBC with Differential/Platelet; Future -     Basic metabolic panel; Future -     Hepatic function panel; Future -     TSH; Future -     Urinalysis, Routine w reflex microscopic; Future -     Discontinue: Azilsartan Medoxomil (EDARBI) 40 MG TABS; Take 1 tablet by mouth daily. -     irbesartan (AVAPRO) 150 MG tablet; Take 1  tablet (150 mg total) by mouth daily.  Routine general medical examination at a health care facility- Exam completed, labs reviewed, vaccines reviewed and updated, colon cancer screening is up-to-date, patient education was given. -     Lipid panel; Future -     PSA; Future  Hyperglycemia- His A1c is at 6.1%.  He is prediabetic.  Medical therapy is not  indicated. -     Basic metabolic panel; Future -     Hemoglobin A1c; Future  Chest tightness- He has symptoms that are suspicious for angina.  He has a significant family history and other risk factors for CAD.  His EKG shows right bundle branch block so he cannot undergo an exercise tolerance test.  I have asked him to undergo a myocardial perfusion imaging to screen for ischemia. -     EKG 12-Lead -     MYOCARDIAL PERFUSION IMAGING; Future  Abnormal electrocardiogram -     MYOCARDIAL PERFUSION IMAGING; Future  Hyperlipidemia with target LDL less than 130- His LDL is 218.  I recommended that he start taking a statin and 81 mg of aspirin a day for CV risk reduction. -     rosuvastatin (CRESTOR) 20 MG tablet; Take 1 tablet (20 mg total) by mouth daily. -     aspirin EC 81 MG tablet; Take 1 tablet (81 mg total) by mouth daily.   I have discontinued Audrie Gallus. Bocek's Fish Oil and Edarbi. I am also having him start on irbesartan, rosuvastatin, and aspirin EC. Additionally, I am having him maintain his cetirizine.  Meds ordered this encounter  Medications  . DISCONTD: Azilsartan Medoxomil (EDARBI) 40 MG TABS    Sig: Take 1 tablet by mouth daily.    Dispense:  90 tablet    Refill:  0  . irbesartan (AVAPRO) 150 MG tablet    Sig: Take 1 tablet (150 mg total) by mouth daily.    Dispense:  90 tablet    Refill:  0  . rosuvastatin (CRESTOR) 20 MG tablet    Sig: Take 1 tablet (20 mg total) by mouth daily.    Dispense:  90 tablet    Refill:  1  . aspirin EC 81 MG tablet    Sig: Take 1 tablet (81 mg total) by mouth daily.    Dispense:  90  tablet    Refill:  3     Follow-up: Return in about 3 months (around 03/26/2019).  Scarlette Calico, MD

## 2018-12-25 ENCOUNTER — Encounter: Payer: Self-pay | Admitting: Internal Medicine

## 2018-12-25 LAB — HEMOGLOBIN A1C: Hgb A1c MFr Bld: 6.1 % (ref 4.6–6.5)

## 2018-12-25 MED ORDER — IRBESARTAN 150 MG PO TABS: 150.0000 mg | ORAL_TABLET | Freq: Every day | ORAL | 0 refills | Status: DC

## 2018-12-25 MED ORDER — ROSUVASTATIN CALCIUM 20 MG PO TABS: 20.0000 mg | ORAL_TABLET | Freq: Every day | ORAL | 1 refills | Status: DC

## 2018-12-25 MED ORDER — ASPIRIN EC 81 MG PO TBEC: 81.0000 mg | DELAYED_RELEASE_TABLET | Freq: Every day | ORAL | 3 refills | Status: DC

## 2018-12-30 ENCOUNTER — Telehealth (HOSPITAL_COMMUNITY): Payer: Self-pay | Admitting: *Deleted

## 2018-12-30 NOTE — Telephone Encounter (Signed)
Patient given detailed instructions per Myocardial Perfusion Study Information Sheet for the test on 01/01/19. Patient notified to arrive 15 minutes early and that it is imperative to arrive on time for appointment to keep from having the test rescheduled.  If you need to cancel or reschedule your appointment, please call the office within 24 hours of your appointment. . Patient verbalized understanding. Lorrain Rivers Jacqueline    

## 2019-01-01 ENCOUNTER — Encounter: Payer: Self-pay | Admitting: Internal Medicine

## 2019-01-01 ENCOUNTER — Ambulatory Visit (HOSPITAL_COMMUNITY): Payer: BC Managed Care – PPO | Attending: Cardiology

## 2019-01-01 ENCOUNTER — Other Ambulatory Visit: Payer: Self-pay

## 2019-01-01 DIAGNOSIS — R0789 Other chest pain: Secondary | ICD-10-CM | POA: Insufficient documentation

## 2019-01-01 DIAGNOSIS — R9431 Abnormal electrocardiogram [ECG] [EKG]: Secondary | ICD-10-CM | POA: Diagnosis not present

## 2019-01-01 LAB — MYOCARDIAL PERFUSION IMAGING
LV dias vol: 77 mL (ref 62–150)
LV sys vol: 31 mL
MPHR: 105 {beats}/min
Rest HR: 86 {beats}/min
SDS: 0
SRS: 0
SSS: 0
TID: 1.15

## 2019-01-01 MED ORDER — TECHNETIUM TC 99M TETROFOSMIN IV KIT
31.7000 | PACK | Freq: Once | INTRAVENOUS | Status: AC | PRN
  Administered 2019-01-01: 31.7 via INTRAVENOUS
  Filled 2019-01-01: qty 32

## 2019-01-01 MED ORDER — REGADENOSON 0.4 MG/5ML IV SOLN
0.4000 mg | Freq: Once | INTRAVENOUS | Status: AC
  Administered 2019-01-01: 0.4 mg via INTRAVENOUS

## 2019-01-01 MED ORDER — TECHNETIUM TC 99M TETROFOSMIN IV KIT
10.6000 | PACK | Freq: Once | INTRAVENOUS | Status: AC | PRN
  Administered 2019-01-01: 10.6 via INTRAVENOUS
  Filled 2019-01-01: qty 11

## 2019-01-03 DIAGNOSIS — Z20828 Contact with and (suspected) exposure to other viral communicable diseases: Secondary | ICD-10-CM | POA: Diagnosis not present

## 2019-02-17 DIAGNOSIS — Z20828 Contact with and (suspected) exposure to other viral communicable diseases: Secondary | ICD-10-CM | POA: Diagnosis not present

## 2019-03-15 DIAGNOSIS — Z23 Encounter for immunization: Secondary | ICD-10-CM | POA: Diagnosis not present

## 2019-03-18 ENCOUNTER — Other Ambulatory Visit: Payer: Self-pay | Admitting: Internal Medicine

## 2019-03-18 DIAGNOSIS — I10 Essential (primary) hypertension: Secondary | ICD-10-CM

## 2019-04-14 DIAGNOSIS — Z23 Encounter for immunization: Secondary | ICD-10-CM | POA: Diagnosis not present

## 2019-06-12 ENCOUNTER — Other Ambulatory Visit: Payer: Self-pay | Admitting: Internal Medicine

## 2019-06-12 DIAGNOSIS — I10 Essential (primary) hypertension: Secondary | ICD-10-CM

## 2019-06-16 ENCOUNTER — Other Ambulatory Visit: Payer: Self-pay | Admitting: Internal Medicine

## 2019-06-16 DIAGNOSIS — E785 Hyperlipidemia, unspecified: Secondary | ICD-10-CM

## 2019-08-14 ENCOUNTER — Other Ambulatory Visit: Payer: Self-pay | Admitting: Internal Medicine

## 2019-08-14 DIAGNOSIS — I10 Essential (primary) hypertension: Secondary | ICD-10-CM

## 2019-08-18 DIAGNOSIS — J069 Acute upper respiratory infection, unspecified: Secondary | ICD-10-CM | POA: Diagnosis not present

## 2019-08-18 DIAGNOSIS — Z681 Body mass index (BMI) 19 or less, adult: Secondary | ICD-10-CM | POA: Diagnosis not present

## 2019-09-17 ENCOUNTER — Encounter: Payer: Self-pay | Admitting: Internal Medicine

## 2019-09-22 ENCOUNTER — Other Ambulatory Visit: Payer: Self-pay

## 2019-09-22 ENCOUNTER — Other Ambulatory Visit: Payer: Self-pay | Admitting: Internal Medicine

## 2019-09-22 DIAGNOSIS — I10 Essential (primary) hypertension: Secondary | ICD-10-CM

## 2019-09-22 MED ORDER — IRBESARTAN 150 MG PO TABS: 150.0000 mg | ORAL_TABLET | Freq: Every day | ORAL | 0 refills | Status: DC

## 2019-09-22 NOTE — Telephone Encounter (Signed)
1.Medication Requested:irbesartan (AVAPRO) 150 MG tablet  2. Pharmacy ([place], [place]):[id number] - [place], [place] - [address]  3. On Med List: Yes   4. Last Visit with PCP: 11.20.20   5. Next visit date with PCP: 8.23.21    Agent: Please be advised that RX refills may take up to 3 business days. We ask that you follow-up with your pharmacy.

## 2019-09-22 NOTE — Telephone Encounter (Signed)
Per office policy sent 30 day to local pharmacy until appt.../lmb  

## 2019-10-13 ENCOUNTER — Ambulatory Visit (INDEPENDENT_AMBULATORY_CARE_PROVIDER_SITE_OTHER): Payer: BC Managed Care – PPO | Admitting: Internal Medicine

## 2019-10-13 ENCOUNTER — Other Ambulatory Visit: Payer: Self-pay

## 2019-10-13 ENCOUNTER — Encounter: Payer: Self-pay | Admitting: Internal Medicine

## 2019-10-13 ENCOUNTER — Ambulatory Visit (INDEPENDENT_AMBULATORY_CARE_PROVIDER_SITE_OTHER): Payer: BC Managed Care – PPO

## 2019-10-13 VITALS — BP 134/88 | HR 73 | Temp 98.4°F | Resp 16 | Ht 70.0 in | Wt 207.0 lb

## 2019-10-13 DIAGNOSIS — E781 Pure hyperglyceridemia: Secondary | ICD-10-CM | POA: Diagnosis not present

## 2019-10-13 DIAGNOSIS — S6992XA Unspecified injury of left wrist, hand and finger(s), initial encounter: Secondary | ICD-10-CM

## 2019-10-13 DIAGNOSIS — E785 Hyperlipidemia, unspecified: Secondary | ICD-10-CM

## 2019-10-13 DIAGNOSIS — I10 Essential (primary) hypertension: Secondary | ICD-10-CM

## 2019-10-13 DIAGNOSIS — R7303 Prediabetes: Secondary | ICD-10-CM | POA: Diagnosis not present

## 2019-10-13 DIAGNOSIS — S62022A Displaced fracture of middle third of navicular [scaphoid] bone of left wrist, initial encounter for closed fracture: Secondary | ICD-10-CM | POA: Diagnosis not present

## 2019-10-13 DIAGNOSIS — Z1159 Encounter for screening for other viral diseases: Secondary | ICD-10-CM | POA: Insufficient documentation

## 2019-10-13 DIAGNOSIS — S62015A Nondisplaced fracture of distal pole of navicular [scaphoid] bone of left wrist, initial encounter for closed fracture: Secondary | ICD-10-CM

## 2019-10-13 NOTE — Progress Notes (Signed)
Subjective:  Patient ID: Joel Wade, male    DOB: 04-04-1965  Age: 54 y.o. MRN: [id number]  CC: Hypertension, Hyperlipidemia, and Hand Injury  This visit occurred during the SARS-CoV-2 public health emergency.  Safety protocols were in place, including screening questions prior to the visit, additional usage of staff PPE, and extensive cleaning of exam room while observing appropriate contact time as indicated for disinfecting solutions.    HPI Tanyon Alipio Deanda presents for f/up - He fell about 3 months ago and injured his left hand/wrist. He has persistent pain and swelling over the hand and wrist.   He tells me his blood pressure has been well controlled.  He is active and denies any recent episodes of chest pain, shortness of breath, palpitations, edema, or fatigue.  He has been working on his lifestyle modifications and has been able to lose weight.  He is tolerating the statin well with no muscle or joint aches.  He denies abdominal pain.  Outpatient Medications Prior to Visit  Medication Sig Dispense Refill  . aspirin EC 81 MG tablet Take 1 tablet (81 mg total) by mouth daily. 90 tablet 3  . cetirizine (ZYRTEC) 5 MG tablet Take 5 mg by mouth daily.    . irbesartan (AVAPRO) 150 MG tablet TAKE 1 TABLET BY MOUTH EVERY DAY 90 tablet 0  . rosuvastatin (CRESTOR) 20 MG tablet TAKE 1 TABLET BY MOUTH EVERY DAY 90 tablet 1   No facility-administered medications prior to visit.    ROS Review of Systems  Constitutional: Negative for appetite change, diaphoresis, fatigue and unexpected weight change.  HENT: Negative.   Eyes: Negative.   Respiratory: Negative for cough, chest tightness, shortness of breath and wheezing.   Cardiovascular: Negative for chest pain, palpitations and leg swelling.  Gastrointestinal: Negative for abdominal pain, constipation, diarrhea, nausea and vomiting.  Endocrine: Negative.   Genitourinary: Negative.  Negative for difficulty urinating.    Musculoskeletal: Positive for arthralgias.  Skin: Negative.   Neurological: Negative.  Negative for dizziness, weakness, light-headedness and headaches.  Hematological: Negative for adenopathy. Does not bruise/bleed easily.  Psychiatric/Behavioral: Negative.     Objective:  BP 134/88 (BP Location: Left Arm, Patient Position: Sitting, Cuff Size: Large)   Pulse 73   Temp 98.4 F (36.9 C) (Oral)   Resp 16   Ht 5\' 10"  (1.778 m)   Wt 207 lb (93.9 kg)   SpO2 95%   BMI 29.70 kg/m   BP Readings from Last 3 Encounters:  10/13/19 134/88  12/24/18 (!) 134/94  12/05/16 128/88    Wt Readings from Last 3 Encounters:  10/13/19 207 lb (93.9 kg)  01/01/19 212 lb (96.2 kg)  12/24/18 212 lb 8 oz (96.4 kg)    Physical Exam Vitals reviewed.  Constitutional:      Appearance: Normal appearance.  HENT:     Nose: Nose normal.     Mouth/Throat:     Mouth: Mucous membranes are moist.  Eyes:     General: No scleral icterus.    Conjunctiva/sclera: Conjunctivae normal.  Cardiovascular:     Rate and Rhythm: Normal rate and regular rhythm.     Heart sounds: No murmur heard.   Pulmonary:     Effort: Pulmonary effort is normal.     Breath sounds: No stridor. No wheezing, rhonchi or rales.  Abdominal:     General: Abdomen is flat.     Palpations: There is no mass.     Tenderness: There is no abdominal tenderness.  There is no guarding.  Musculoskeletal:        General: Swelling, tenderness and signs of injury present. Normal range of motion.     Right wrist: Normal.     Left wrist: Swelling and bony tenderness present. No deformity, effusion, lacerations, tenderness, snuff box tenderness or crepitus. Normal range of motion. Normal pulse.     Cervical back: Neck supple.     Comments: There is a subtle area of swelling and tenderness over the dorsomedial surface of the wrist that extends onto the hand.  Lymphadenopathy:     Cervical: No cervical adenopathy.  Skin:    General: Skin is warm  and dry.  Neurological:     General: No focal deficit present.     Mental Status: He is alert.  Psychiatric:        Mood and Affect: Mood normal.        Behavior: Behavior normal.     Lab Results  Component Value Date   WBC 7.1 12/24/2018   HGB 15.1 12/24/2018   HCT 45.0 12/24/2018   PLT 284.0 12/24/2018   GLUCOSE 108 (H) 10/13/2019   CHOL 168 10/13/2019   TRIG 152 (H) 10/13/2019   HDL 46 10/13/2019   LDLDIRECT 218.0 12/24/2018   LDLCALC 97 10/13/2019   ALT 19 12/24/2018   AST 14 12/24/2018   NA 137 10/13/2019   K 4.4 10/13/2019   CL 103 10/13/2019   CREATININE 0.89 10/13/2019   BUN 18 10/13/2019   CO2 24 10/13/2019   TSH 2.13 12/24/2018   PSA 1.43 12/24/2018   HGBA1C 6.0 (H) 10/13/2019    DG Chest 2 View  Result Date: 03/03/2014 CLINICAL DATA:  Cough and congestion . EXAM: CHEST  2 VIEW COMPARISON:  10/31/2007. FINDINGS: Mediastinum hilar structures are normal. Heart size normal. Pulmonary vascularity normal. Mild subsegmental atelectasis both lung bases. No pleural effusion or pneumothorax. No acute bony abnormality identified. Degenerative changes thoracic spine. IMPRESSION: Mild subsegmental atelectasis both lung bases, otherwise negative chest. Electronically Signed   By: Marcello Moores  Register   On: 03/03/2014 09:27   DG Wrist Complete Left  Result Date: 10/13/2019 CLINICAL DATA:  Pain following injury EXAM: LEFT WRIST - COMPLETE 3+ VIEW COMPARISON:  Left hand radiographs October 13, 2019 FINDINGS: Oblique, lateral, and ulnar deviation scaphoid images obtained. There is a transversely oriented fracture the junction of mid and distal thirds of the scaphoid bone with slight displacement of fracture fragments. No other fracture evident. No dislocation. Joint spaces appear normal. No erosive changes. IMPRESSION: Mildly displaced fracture at the junction mid and distal thirds of the scaphoid bone. No other fracture. No dislocation. No appreciable arthropathy. These results will be  called to the ordering clinician or representative by the Radiologist Assistant, and communication documented in the PACS or [employer]. Electronically Signed   By: Lowella Grip III M.D.   On: 10/13/2019 09:06   DG Hand Complete Left  Result Date: 10/13/2019 CLINICAL DATA:  Pain following injury EXAM: LEFT HAND - COMPLETE 3+ VIEW COMPARISON:  None. FINDINGS: Frontal, oblique, and lateral views were obtained. There is a fracture at the junction mid and distal thirds of the scaphoid bone with alignment overall near anatomic. No other fracture. No dislocation. Joint spaces appear normal. No erosive change. IMPRESSION: Fracture at the junction mid and distal thirds the scaphoid bone. Alignment near anatomic. No other fracture. No dislocation. No appreciable arthropathy. Electronically Signed   By: Lowella Grip III M.D.   On: 10/13/2019  09:05    Assessment & Plan:   Rashaun was seen today for hypertension, hyperlipidemia and hand injury.  Diagnoses and all orders for this visit:  Essential hypertension- His blood pressure is well controlled.  Electrolytes and renal function are normal. -     BASIC METABOLIC PANEL WITH GFR; Future -     BASIC METABOLIC PANEL WITH GFR  Hyperlipidemia with target LDL less than 130- He has achieved his LDL goal and is doing well on the statin. -     Lipid panel; Future -     Lipid panel  Pure hypertriglyceridemia- Improvement noted. -     Lipid panel; Future -     Lipid panel  Need for hepatitis C screening test -     Hepatitis C antibody; Future -     Hepatitis C antibody  Prediabetes- His A1c is at 6.0%.  Medical therapy is not indicated. -     Hemoglobin A1c; Future -     Hemoglobin A1c  Hand injuries, left, initial encounter -     DG Hand Complete Left; Future  Left wrist injury, initial encounter -     DG Wrist Complete Left; Future  Closed nondisplaced fracture of distal pole of scaphoid bone of left wrist, initial encounter- I have  asked him to see orthopedics about this. -     Ambulatory referral to Orthopedic Surgery   I am having Audrie Gallus. Thall maintain his cetirizine, aspirin EC, rosuvastatin, and irbesartan.  No orders of the defined types were placed in this encounter.  I spent 50 minutes in preparing to see the patient by review of recent labs, imaging and procedures, obtaining and reviewing separately obtained history, communicating with the patient and family or caregiver, ordering medications, tests or procedures, and documenting clinical information in the EHR including the differential Dx, treatment, and any further evaluation and other management of 1. Essential hypertension 2. Hyperlipidemia with target LDL less than 130 3. Pure hypertriglyceridemia 4. Prediabetes 5. Hand injuries, left, initial encounter 6 Left wrist injury, initial encounter 7. Closed nondisplaced fracture of distal pole of scaphoid bone of left wrist, initial encounter     Follow-up: Return in about 4 weeks (around 11/10/2019).  Scarlette Calico, MD

## 2019-10-13 NOTE — Patient Instructions (Signed)
Hand Pain Many things can cause hand pain. Some common causes are:  An injury.  Repeating the same movement with your hand over and over (overuse).  Osteoporosis.  Arthritis.  Lumps in the tendons or joints of the hand and wrist (ganglion cysts).  Nerve compression syndromes (carpal tunnel syndrome).  Inflammation of the tendons (tendinitis).  Infection. Follow these instructions at home: Pay attention to any changes in your symptoms. Take these actions to help with your discomfort: Managing pain, stiffness, and swelling   Take over-the-counter and prescription medicines only as told by your health care provider.  Wear a hand splint or support as told by your health care provider.  If directed, put ice on the affected area: ? Put ice in a plastic bag. ? Place a towel between your skin and the bag. ? Leave the ice on for 20 minutes, 2-3 times a day. Activity  Take breaks from repetitive activity often.  Avoid activities that make your pain worse.  Minimize stress on your hands and wrists as much as possible.  Do stretches or exercises as told by your health care provider.  Do not do activities that make your pain worse. Contact a health care provider if:  Your pain does not get better after a few days of self-care.  Your pain gets worse.  Your pain affects your ability to do your daily activities. Get help right away if:  Your hand becomes warm, red, or swollen.  Your hand is numb or tingling.  Your hand is extremely swollen or deformed.  Your hand or fingers turn white or blue.  You cannot move your hand, wrist, or fingers. Summary  Many things can cause hand pain.  Contact your health care provider if your pain does not get better after a few days of self care.  Minimize stress on your hands and wrists as much as possible.  Do not do activities that make your pain worse. This information is not intended to replace advice given to you by your  health care provider. Make sure you discuss any questions you have with your health care provider. Document Revised: 11/02/2017 Document Reviewed: 11/02/2017 Elsevier Patient Education  2020 [employer]  

## 2019-10-14 ENCOUNTER — Other Ambulatory Visit: Payer: Self-pay | Admitting: Internal Medicine

## 2019-10-14 LAB — HEMOGLOBIN A1C
Hgb A1c MFr Bld: 6 % of total Hgb — ABNORMAL HIGH (ref <5.7)
Mean Plasma Glucose: 126 (calc)
eAG (mmol/L): 7 (calc)

## 2019-10-14 LAB — BASIC METABOLIC PANEL WITH GFR
BUN: 18 mg/dL (ref 7–25)
CO2: 24 mmol/L (ref 20–32)
Calcium: 9.2 mg/dL (ref 8.6–10.3)
Chloride: 103 mmol/L (ref 98–110)
Creat: 0.89 mg/dL (ref 0.70–1.33)
GFR, Est African American: 112 mL/min/{1.73_m2} (ref >=60)
GFR, Est Non African American: 97 mL/min/{1.73_m2} (ref >=60)
Glucose, Bld: 108 mg/dL — ABNORMAL HIGH (ref 65–99)
Potassium: 4.4 mmol/L (ref 3.5–5.3)
Sodium: 137 mmol/L (ref 135–146)

## 2019-10-14 LAB — HEPATITIS C ANTIBODY
Hepatitis C Ab: NONREACTIVE
SIGNAL TO CUT-OFF: 0.01 (ref <1.00)

## 2019-10-14 LAB — LIPID PANEL
Cholesterol: 168 mg/dL (ref <200)
HDL: 46 mg/dL (ref >=40)
LDL Cholesterol (Calc): 97 mg/dL (calc)
Non-HDL Cholesterol (Calc): 122 mg/dL (calc) (ref <130)
Total CHOL/HDL Ratio: 3.7 (calc) (ref <5.0)
Triglycerides: 152 mg/dL — ABNORMAL HIGH (ref <150)

## 2019-10-23 ENCOUNTER — Ambulatory Visit (INDEPENDENT_AMBULATORY_CARE_PROVIDER_SITE_OTHER): Payer: BC Managed Care – PPO | Admitting: Orthopaedic Surgery

## 2019-10-23 ENCOUNTER — Encounter: Payer: Self-pay | Admitting: Orthopaedic Surgery

## 2019-10-23 VITALS — Ht 69.0 in | Wt 208.6 lb

## 2019-10-23 DIAGNOSIS — S62025A Nondisplaced fracture of middle third of navicular [scaphoid] bone of left wrist, initial encounter for closed fracture: Secondary | ICD-10-CM | POA: Insufficient documentation

## 2019-10-23 DIAGNOSIS — S62025K Nondisplaced fracture of middle third of navicular [scaphoid] bone of left wrist, subsequent encounter for fracture with nonunion: Secondary | ICD-10-CM | POA: Diagnosis not present

## 2019-10-23 NOTE — Telephone Encounter (Signed)
yes

## 2019-10-23 NOTE — Progress Notes (Signed)
Office Visit Note   Patient: Joel Wade           Date of Birth: 30-Nov-1965           MRN: [id number] Visit Date: 10/23/2019              Requested by: Janith Lima, MD [address] [place],  [place] [address] PCP: Janith Lima, MD   Assessment & Plan: Visit Diagnoses:  1. 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  I reviewed the x-rays with the patient in detail and I have recommended referral to hand surgery for repair of the scaphoid nonunion.  Follow-up as needed.  Follow-Up Instructions: No follow-ups on file.   Orders:  No orders of the defined types were placed in this encounter.  No orders of the defined types were placed in this encounter.     Procedures: No procedures performed   Clinical Data: No additional findings.   Subjective: Chief Complaint  Patient presents with  . Left Wrist - Fracture    Mr. Joel Wade is a very pleasant 54 year old gentleman right-hand-dominant who comes in for evaluation of chronic left wrist pain.  Initially had a mechanical fall onto an outstretched left hand in May.  He subsequently had pain and swelling but the pain was overall bearable and so he never sought medical treatment.  Has been a little over 3 months since the injury and he has continued to have weakness in grip and he has trouble holding up his granddaughter.  He will have shooting pains for his arm.   Review of Systems  Constitutional: Negative.   All other systems reviewed and are negative.    Objective: Vital Signs: Ht 5\' 9"  (1.753 m)   Wt 208 lb 9.6 oz (94.6 kg)   BMI 30.80 kg/m   Physical Exam Vitals and nursing note reviewed.  Constitutional:      Appearance: He is well-developed.  HENT:     Head: Normocephalic and atraumatic.  Eyes:     Pupils: Pupils are equal, round, and reactive to light.  Pulmonary:     Effort:  Pulmonary effort is normal.  Abdominal:     Palpations: Abdomen is soft.  Musculoskeletal:        General: Normal range of motion.     Cervical back: Neck supple.  Skin:    General: Skin is warm.  Neurological:     Mental Status: He is alert and oriented to person, place, and time.  Psychiatric:        Behavior: Behavior normal.        Thought Content: Thought content normal.        Judgment: Judgment normal.     Ortho Exam Left wrist shows no significant tenderness of the anatomic snuffbox.  He has mild swelling of the wrist overall.  Mild restriction in range of motion with mild pain.  No neurovascular compromise.  Grip strength is slightly decreased secondary to pain. Specialty Comments:  No specialty comments available.  Imaging: No results found.   PMFS History: Patient Active Problem List   Diagnosis Date Noted  . Closed nondisplaced fracture of middle third of navicular bone of left wrist 10/23/2019  . Pure hypertriglyceridemia 10/13/2019  . Need for hepatitis C screening test 10/13/2019  . Hand injuries, left, initial encounter 10/13/2019  . Left wrist injury, initial encounter 10/13/2019  .  Closed nondisplaced fracture of distal pole of navicular bone of left wrist 10/13/2019  . Screen for colon cancer 05/30/2015  . Routine general medical examination at a health care facility 02/17/2015  . Hyperlipidemia with target LDL less than 130 02/17/2015  . Essential hypertension 02/17/2015  . Prediabetes 02/17/2015   Past Medical History:  Diagnosis Date  . Hyperlipidemia     Family History  Problem Relation Age of Onset  . Heart disease Father   . Hyperlipidemia Father   . Hypertension Father   . Diabetes Mother   . Drug abuse Brother   . Early death Brother   . Cancer Neg Hx   . Kidney disease Neg Hx   . Stroke Neg Hx   . Colon cancer Neg Hx     Past Surgical History:  Procedure Laterality Date  . APPENDECTOMY  2005  . HERNIA REPAIR Right 2005  . WRIST  FRACTURE SURGERY Right 2009   Social History   Occupational History  . Not on file  Tobacco Use  . Smoking status: Never Smoker  . Smokeless tobacco: Never Used  Substance and Sexual Activity  . Alcohol use: Yes    Alcohol/week: 0.0 standard drinks    Comment: social  . Drug use: No  . Sexual activity: Yes

## 2019-10-24 ENCOUNTER — Other Ambulatory Visit: Payer: Self-pay

## 2019-10-24 DIAGNOSIS — S62025K Nondisplaced fracture of middle third of navicular [scaphoid] bone of left wrist, subsequent encounter for fracture with nonunion: Secondary | ICD-10-CM

## 2019-10-28 ENCOUNTER — Encounter: Payer: Self-pay | Admitting: Internal Medicine

## 2019-11-03 DIAGNOSIS — S62025A Nondisplaced fracture of middle third of navicular [scaphoid] bone of left wrist, initial encounter for closed fracture: Secondary | ICD-10-CM | POA: Diagnosis not present

## 2019-11-04 ENCOUNTER — Other Ambulatory Visit: Payer: Self-pay | Admitting: Orthopedic Surgery

## 2019-11-04 DIAGNOSIS — S62025P Nondisplaced fracture of middle third of navicular [scaphoid] bone of left wrist, subsequent encounter for fracture with malunion: Secondary | ICD-10-CM

## 2019-11-12 ENCOUNTER — Other Ambulatory Visit: Payer: BC Managed Care – PPO

## 2019-11-12 ENCOUNTER — Ambulatory Visit
Admission: RE | Admit: 2019-11-12 | Discharge: 2019-11-12 | Disposition: A | Discharge status: Home / Self Care | Payer: BC Managed Care – PPO | Source: Ambulatory Visit | Attending: Orthopedic Surgery | Admitting: Orthopedic Surgery

## 2019-11-12 ENCOUNTER — Other Ambulatory Visit: Payer: Self-pay

## 2019-11-12 DIAGNOSIS — S62012A Displaced fracture of distal pole of navicular [scaphoid] bone of left wrist, initial encounter for closed fracture: Secondary | ICD-10-CM | POA: Diagnosis not present

## 2019-11-12 DIAGNOSIS — S62025P Nondisplaced fracture of middle third of navicular [scaphoid] bone of left wrist, subsequent encounter for fracture with malunion: Secondary | ICD-10-CM

## 2019-11-14 DIAGNOSIS — S62025G Nondisplaced fracture of middle third of navicular [scaphoid] bone of left wrist, subsequent encounter for fracture with delayed healing: Secondary | ICD-10-CM | POA: Diagnosis not present

## 2019-11-14 DIAGNOSIS — M25632 Stiffness of left wrist, not elsewhere classified: Secondary | ICD-10-CM | POA: Diagnosis not present

## 2019-11-14 DIAGNOSIS — M25532 Pain in left wrist: Secondary | ICD-10-CM | POA: Diagnosis not present

## 2019-11-14 DIAGNOSIS — S62025A Nondisplaced fracture of middle third of navicular [scaphoid] bone of left wrist, initial encounter for closed fracture: Secondary | ICD-10-CM | POA: Diagnosis not present

## 2019-11-14 DIAGNOSIS — S62025K Nondisplaced fracture of middle third of navicular [scaphoid] bone of left wrist, subsequent encounter for fracture with nonunion: Secondary | ICD-10-CM | POA: Diagnosis not present

## 2019-12-03 DIAGNOSIS — S62025K Nondisplaced fracture of middle third of navicular [scaphoid] bone of left wrist, subsequent encounter for fracture with nonunion: Secondary | ICD-10-CM | POA: Diagnosis not present

## 2019-12-10 ENCOUNTER — Other Ambulatory Visit: Payer: Self-pay | Admitting: Internal Medicine

## 2019-12-10 DIAGNOSIS — E785 Hyperlipidemia, unspecified: Secondary | ICD-10-CM

## 2019-12-15 ENCOUNTER — Other Ambulatory Visit: Payer: Self-pay | Admitting: Internal Medicine

## 2019-12-15 DIAGNOSIS — I10 Essential (primary) hypertension: Secondary | ICD-10-CM

## 2019-12-26 DIAGNOSIS — S62025K Nondisplaced fracture of middle third of navicular [scaphoid] bone of left wrist, subsequent encounter for fracture with nonunion: Secondary | ICD-10-CM | POA: Diagnosis not present

## 2020-01-26 ENCOUNTER — Ambulatory Visit (INDEPENDENT_AMBULATORY_CARE_PROVIDER_SITE_OTHER): Payer: BC Managed Care – PPO | Admitting: Internal Medicine

## 2020-01-26 ENCOUNTER — Encounter: Payer: Self-pay | Admitting: Internal Medicine

## 2020-01-26 ENCOUNTER — Other Ambulatory Visit: Payer: Self-pay

## 2020-01-26 VITALS — BP 132/82 | HR 93 | Temp 98.2°F | Resp 16 | Ht 69.0 in | Wt 228.0 lb

## 2020-01-26 DIAGNOSIS — I10 Essential (primary) hypertension: Secondary | ICD-10-CM

## 2020-01-26 DIAGNOSIS — R7303 Prediabetes: Secondary | ICD-10-CM

## 2020-01-26 DIAGNOSIS — Z0000 Encounter for general adult medical examination without abnormal findings: Secondary | ICD-10-CM | POA: Diagnosis not present

## 2020-01-26 DIAGNOSIS — Z0001 Encounter for general adult medical examination with abnormal findings: Secondary | ICD-10-CM

## 2020-01-26 LAB — BASIC METABOLIC PANEL
BUN: 16 mg/dL (ref 6–23)
CO2: 27 mEq/L (ref 19–32)
Calcium: 9 mg/dL (ref 8.4–10.5)
Chloride: 105 mEq/L (ref 96–112)
Creatinine, Ser: 1.06 mg/dL (ref 0.40–1.50)
GFR: 79.46 mL/min (ref >60.00)
Glucose, Bld: 109 mg/dL — ABNORMAL HIGH (ref 70–99)
Potassium: 4.6 mEq/L (ref 3.5–5.1)
Sodium: 139 mEq/L (ref 135–145)

## 2020-01-26 LAB — CBC WITH DIFFERENTIAL/PLATELET
Basophils Absolute: 0 10*3/uL (ref 0.0–0.1)
Basophils Relative: 0.7 % (ref 0.0–3.0)
Eosinophils Absolute: 0.3 10*3/uL (ref 0.0–0.7)
Eosinophils Relative: 5 % (ref 0.0–5.0)
HCT: 41.2 % (ref 39.0–52.0)
Hemoglobin: 14.1 g/dL (ref 13.0–17.0)
Lymphocytes Relative: 28 % (ref 12.0–46.0)
Lymphs Abs: 1.6 10*3/uL (ref 0.7–4.0)
MCHC: 34.3 g/dL (ref 30.0–36.0)
MCV: 92.6 fl (ref 78.0–100.0)
Monocytes Absolute: 0.6 10*3/uL (ref 0.1–1.0)
Monocytes Relative: 10.7 % (ref 3.0–12.0)
Neutro Abs: 3.2 10*3/uL (ref 1.4–7.7)
Neutrophils Relative %: 55.6 % (ref 43.0–77.0)
Platelets: 249 10*3/uL (ref 150.0–400.0)
RBC: 4.45 Mil/uL (ref 4.22–5.81)
RDW: 13.1 % (ref 11.5–15.5)
WBC: 5.7 10*3/uL (ref 4.0–10.5)

## 2020-01-26 LAB — LIPID PANEL
Cholesterol: 161 mg/dL (ref 0–200)
HDL: 41.1 mg/dL (ref >39.00)
LDL Cholesterol: 86 mg/dL (ref 0–99)
NonHDL: 119.72
Total CHOL/HDL Ratio: 4
Triglycerides: 170 mg/dL — ABNORMAL HIGH (ref 0.0–149.0)
VLDL: 34 mg/dL (ref 0.0–40.0)

## 2020-01-26 LAB — HEPATIC FUNCTION PANEL
ALT: 28 U/L (ref 0–53)
AST: 16 U/L (ref 0–37)
Albumin: 4.3 g/dL (ref 3.5–5.2)
Alkaline Phosphatase: 48 U/L (ref 39–117)
Bilirubin, Direct: 0.1 mg/dL (ref 0.0–0.3)
Total Bilirubin: 0.5 mg/dL (ref 0.2–1.2)
Total Protein: 7 g/dL (ref 6.0–8.3)

## 2020-01-26 LAB — PSA: PSA: 1.63 ng/mL (ref 0.10–4.00)

## 2020-01-26 LAB — HEMOGLOBIN A1C: Hgb A1c MFr Bld: 6.1 % (ref 4.6–6.5)

## 2020-01-26 LAB — TSH: TSH: 2.05 u[IU]/mL (ref 0.35–4.50)

## 2020-01-26 MED ORDER — IRBESARTAN 150 MG PO TABS: 150.0000 mg | ORAL_TABLET | Freq: Every day | ORAL | 1 refills | Status: DC

## 2020-01-26 NOTE — Patient Instructions (Signed)

## 2020-01-26 NOTE — Progress Notes (Signed)
Subjective:  Patient ID: Joel Wade, male    DOB: 06/13/1965  Age: 54 y.o. MRN: [id number]  CC: Annual Exam, Hypertension, and Hyperlipidemia  This visit occurred during the SARS-CoV-2 public health emergency.  Safety protocols were in place, including screening questions prior to the visit, additional usage of staff PPE, and extensive cleaning of exam room while observing appropriate contact time as indicated for disinfecting solutions.    HPI Joel Wade presents for a CPX.  He tells me that his blood pressure has been well controlled.  He is active and denies any recent episodes of chest pain, shortness of breath, DOE, dizziness, lightheadedness, edema, or fatigue.  Outpatient Medications Prior to Visit  Medication Sig Dispense Refill  . aspirin EC 81 MG tablet Take 1 tablet (81 mg total) by mouth daily. 90 tablet 3  . cetirizine (ZYRTEC) 5 MG tablet Take 5 mg by mouth daily.    . rosuvastatin (CRESTOR) 20 MG tablet TAKE 1 TABLET BY MOUTH EVERY DAY 90 tablet 1  . irbesartan (AVAPRO) 150 MG tablet TAKE 1 TABLET BY MOUTH EVERY DAY 90 tablet 0   No facility-administered medications prior to visit.    ROS Review of Systems  Constitutional: Negative for appetite change, chills, fatigue and fever.  HENT: Negative.   Eyes: Negative for visual disturbance.  Respiratory: Negative for cough, chest tightness, shortness of breath and wheezing.   Cardiovascular: Negative for chest pain, palpitations and leg swelling.  Gastrointestinal: Negative for abdominal pain, constipation, diarrhea, nausea and vomiting.  Endocrine: Negative.   Genitourinary: Negative.  Negative for difficulty urinating and dysuria.  Musculoskeletal: Negative.  Negative for arthralgias and myalgias.  Skin: Negative.  Negative for color change and pallor.  Neurological: Negative.  Negative for dizziness, weakness and headaches.  Hematological: Negative for adenopathy. Does not bruise/bleed easily.   Psychiatric/Behavioral: Negative.     Objective:  BP 132/82   Pulse 93   Temp 98.2 F (36.8 C) (Oral)   Resp 16   Ht 5\' 9"  (1.753 m)   Wt 228 lb (103.4 kg)   SpO2 97%   BMI 33.67 kg/m   BP Readings from Last 3 Encounters:  01/26/20 132/82  10/13/19 134/88  12/24/18 (!) 134/94    Wt Readings from Last 3 Encounters:  01/26/20 228 lb (103.4 kg)  10/23/19 208 lb 9.6 oz (94.6 kg)  10/13/19 207 lb (93.9 kg)    Physical Exam Vitals reviewed. Exam conducted with a chaperone present.  Constitutional:      Appearance: Normal appearance.  HENT:     Nose: Nose normal.     Mouth/Throat:     Mouth: Mucous membranes are moist.  Eyes:     General: No scleral icterus.    Conjunctiva/sclera: Conjunctivae normal.  Cardiovascular:     Rate and Rhythm: Normal rate and regular rhythm.     Heart sounds: No murmur heard.  No gallop.   Pulmonary:     Effort: Pulmonary effort is normal.     Breath sounds: No stridor. No wheezing, rhonchi or rales.  Abdominal:     General: Abdomen is flat.     Palpations: There is no mass.     Tenderness: There is no abdominal tenderness. There is no guarding.     Hernia: No hernia is present. There is no hernia in the left inguinal area or right inguinal area.  Genitourinary:    Pubic Area: No rash.      Penis: Normal and circumcised. No  lesions.      Testes: Normal.        Right: Mass or tenderness not present.        Left: Mass or tenderness not present.     Epididymis:     Right: Normal.     Left: Normal.     Prostate: Normal. Not enlarged, not tender and no nodules present.     Rectum: Normal. Guaiac result negative. No mass, tenderness, anal fissure, external hemorrhoid or internal hemorrhoid. Normal anal tone.  Musculoskeletal:        General: Normal range of motion.     Cervical back: Neck supple.     Right lower leg: No edema.     Left lower leg: No edema.  Lymphadenopathy:     Cervical: No cervical adenopathy.     Lower Body: No  right inguinal adenopathy. No left inguinal adenopathy.  Skin:    General: Skin is warm and dry.     Coloration: Skin is not pale.  Neurological:     General: No focal deficit present.     Mental Status: He is alert.  Psychiatric:        Mood and Affect: Mood normal.        Behavior: Behavior normal.     Lab Results  Component Value Date   WBC 5.7 01/26/2020   HGB 14.1 01/26/2020   HCT 41.2 01/26/2020   PLT 249.0 01/26/2020   GLUCOSE 109 (H) 01/26/2020   CHOL 161 01/26/2020   TRIG 170.0 (H) 01/26/2020   HDL 41.10 01/26/2020   LDLDIRECT 218.0 12/24/2018   LDLCALC 86 01/26/2020   ALT 28 01/26/2020   AST 16 01/26/2020   NA 139 01/26/2020   K 4.6 01/26/2020   CL 105 01/26/2020   CREATININE 1.06 01/26/2020   BUN 16 01/26/2020   CO2 27 01/26/2020   TSH 2.05 01/26/2020   PSA 1.63 01/26/2020   HGBA1C 6.1 01/26/2020    CT WRIST LEFT WO CONTRAST  Result Date: 11/12/2019 CLINICAL DATA:  Wrist pain, swelling and limited range of motion since falling in May. Evaluate scaphoid fracture. EXAM: CT OF THE LEFT WRIST WITHOUT CONTRAST TECHNIQUE: Multidetector CT imaging was performed according to the standard protocol. Multiplanar CT image reconstructions were also generated. COMPARISON:  Radiographs 10/13/2019.  No remote studies available. FINDINGS: Bones/Joint/Cartilage As demonstrated on prior radiographs, there is a transverse fracture through the scaphoid waist, most obvious on the coronal images. This fracture is mildly displaced posteriorly, by 2-3 mm (image 24/7). Anteriorly, there is no displacement, and there may be partial healing in this area. There is no evidence of avascular necrosis of the proximal pole. There are several small bone fragments posteriorly, best seen on the sagittal images. There is an additional small fracture fragment distally, best seen on coronal image 23/7. No other definite acute fractures. Mild carpal bone spurring in the radial aspect of the wrist. Mild  dorsal tilting of the scaphoid on the sagittal images which could reflect early dorsal intercalated segmental instability. Ligaments Suboptimally assessed by CT. No widening of the scapholunate or lunotriquetral intervals. Muscles and Tendons Unremarkable. Soft tissues No hematoma or foreign body. IMPRESSION: 1. Mildly displaced transverse fracture through the scaphoid waist. There may be partial healing of this fracture anteriorly. 2. No evidence of avascular necrosis of the proximal pole. 3. Mild dorsal tilting of the scaphoid on the sagittal images which could reflect early dorsal intercalated segmental instability. Electronically Signed   By: Caryl Comes.D.  On: 11/12/2019 13:30    Assessment & Plan:   Joel Wade was seen today for annual exam, hypertension and hyperlipidemia.  Diagnoses and all orders for this visit:  Essential hypertension- His blood pressure is well controlled.  Electrolytes and renal function are normal.  Labs are negative for secondary causes or endorgan damage. -     irbesartan (AVAPRO) 150 MG tablet; Take 1 tablet (150 mg total) by mouth daily. -     CBC with Differential/Platelet; Future -     Basic metabolic panel; Future -     TSH; Future -     Hepatic function panel; Future -     Hepatic function panel -     TSH -     Basic metabolic panel -     CBC with Differential/Platelet  Prediabetes- His A1c is up to 6.1%.  Medical therapy is not indicated.  He continues to work on his lifestyle modifications. -     Basic metabolic panel; Future -     Hepatic function panel; Future -     Hemoglobin A1c; Future -     Hemoglobin A1c -     Hepatic function panel -     Basic metabolic panel  Encounter for general adult medical examination with abnormal findings- Exam completed, labs reviewed, vaccines reviewed and updated, cancer screenings are up-to-date, patient education was given. -     Lipid panel; Future -     PSA; Future -     PSA -     Lipid panel   I  have changed Audrie Gallus. [place]'s irbesartan. I am also having him maintain his cetirizine, aspirin EC, and rosuvastatin.  Meds ordered this encounter  Medications  . irbesartan (AVAPRO) 150 MG tablet    Sig: Take 1 tablet (150 mg total) by mouth daily.    Dispense:  90 tablet    Refill:  1     Follow-up: Return in about 6 months (around 07/26/2020).  Scarlette Calico, MD

## 2020-02-06 DIAGNOSIS — S62025K Nondisplaced fracture of middle third of navicular [scaphoid] bone of left wrist, subsequent encounter for fracture with nonunion: Secondary | ICD-10-CM | POA: Diagnosis not present

## 2020-03-19 DIAGNOSIS — S62025K Nondisplaced fracture of middle third of navicular [scaphoid] bone of left wrist, subsequent encounter for fracture with nonunion: Secondary | ICD-10-CM | POA: Diagnosis not present

## 2020-04-30 ENCOUNTER — Other Ambulatory Visit: Payer: Self-pay | Admitting: Orthopedic Surgery

## 2020-04-30 DIAGNOSIS — S62025K Nondisplaced fracture of middle third of navicular [scaphoid] bone of left wrist, subsequent encounter for fracture with nonunion: Secondary | ICD-10-CM | POA: Diagnosis not present

## 2020-05-03 ENCOUNTER — Ambulatory Visit
Admission: RE | Admit: 2020-05-03 | Discharge: 2020-05-03 | Disposition: A | Discharge status: Home / Self Care | Payer: BC Managed Care – PPO | Source: Ambulatory Visit | Attending: Orthopedic Surgery | Admitting: Orthopedic Surgery

## 2020-05-03 ENCOUNTER — Other Ambulatory Visit: Payer: Self-pay

## 2020-05-03 DIAGNOSIS — S62025K Nondisplaced fracture of middle third of navicular [scaphoid] bone of left wrist, subsequent encounter for fracture with nonunion: Secondary | ICD-10-CM

## 2020-05-03 DIAGNOSIS — S62002A Unspecified fracture of navicular [scaphoid] bone of left wrist, initial encounter for closed fracture: Secondary | ICD-10-CM | POA: Diagnosis not present

## 2020-05-10 DIAGNOSIS — S62025K Nondisplaced fracture of middle third of navicular [scaphoid] bone of left wrist, subsequent encounter for fracture with nonunion: Secondary | ICD-10-CM | POA: Diagnosis not present

## 2020-05-12 ENCOUNTER — Other Ambulatory Visit: Payer: Self-pay | Admitting: Orthopedic Surgery

## 2020-05-12 ENCOUNTER — Other Ambulatory Visit: Payer: BC Managed Care – PPO

## 2020-05-12 DIAGNOSIS — S62025K Nondisplaced fracture of middle third of navicular [scaphoid] bone of left wrist, subsequent encounter for fracture with nonunion: Secondary | ICD-10-CM

## 2020-05-13 ENCOUNTER — Ambulatory Visit
Admission: RE | Admit: 2020-05-13 | Discharge: 2020-05-13 | Disposition: A | Discharge status: Home / Self Care | Payer: BC Managed Care – PPO | Source: Ambulatory Visit | Attending: Orthopedic Surgery | Admitting: Orthopedic Surgery

## 2020-05-13 ENCOUNTER — Other Ambulatory Visit: Payer: Self-pay

## 2020-05-13 DIAGNOSIS — S62002A Unspecified fracture of navicular [scaphoid] bone of left wrist, initial encounter for closed fracture: Secondary | ICD-10-CM | POA: Diagnosis not present

## 2020-05-13 DIAGNOSIS — R531 Weakness: Secondary | ICD-10-CM | POA: Diagnosis not present

## 2020-05-13 DIAGNOSIS — S62102A Fracture of unspecified carpal bone, left wrist, initial encounter for closed fracture: Secondary | ICD-10-CM | POA: Diagnosis not present

## 2020-05-13 DIAGNOSIS — S62025K Nondisplaced fracture of middle third of navicular [scaphoid] bone of left wrist, subsequent encounter for fracture with nonunion: Secondary | ICD-10-CM

## 2020-05-17 DIAGNOSIS — S62025K Nondisplaced fracture of middle third of navicular [scaphoid] bone of left wrist, subsequent encounter for fracture with nonunion: Secondary | ICD-10-CM | POA: Diagnosis not present

## 2020-05-24 DIAGNOSIS — S62025K Nondisplaced fracture of middle third of navicular [scaphoid] bone of left wrist, subsequent encounter for fracture with nonunion: Secondary | ICD-10-CM | POA: Diagnosis not present

## 2020-05-24 DIAGNOSIS — M25632 Stiffness of left wrist, not elsewhere classified: Secondary | ICD-10-CM | POA: Diagnosis not present

## 2020-05-24 DIAGNOSIS — M25532 Pain in left wrist: Secondary | ICD-10-CM | POA: Diagnosis not present

## 2020-06-01 ENCOUNTER — Other Ambulatory Visit: Payer: Self-pay | Admitting: Internal Medicine

## 2020-06-01 DIAGNOSIS — E785 Hyperlipidemia, unspecified: Secondary | ICD-10-CM

## 2020-06-02 DIAGNOSIS — M25632 Stiffness of left wrist, not elsewhere classified: Secondary | ICD-10-CM | POA: Diagnosis not present

## 2020-06-02 DIAGNOSIS — M25532 Pain in left wrist: Secondary | ICD-10-CM | POA: Diagnosis not present

## 2020-06-02 DIAGNOSIS — S62025K Nondisplaced fracture of middle third of navicular [scaphoid] bone of left wrist, subsequent encounter for fracture with nonunion: Secondary | ICD-10-CM | POA: Diagnosis not present

## 2020-06-14 DIAGNOSIS — M25532 Pain in left wrist: Secondary | ICD-10-CM | POA: Diagnosis not present

## 2020-06-14 DIAGNOSIS — S62025K Nondisplaced fracture of middle third of navicular [scaphoid] bone of left wrist, subsequent encounter for fracture with nonunion: Secondary | ICD-10-CM | POA: Diagnosis not present

## 2020-06-14 DIAGNOSIS — M25632 Stiffness of left wrist, not elsewhere classified: Secondary | ICD-10-CM | POA: Diagnosis not present

## 2020-06-21 DIAGNOSIS — S62025K Nondisplaced fracture of middle third of navicular [scaphoid] bone of left wrist, subsequent encounter for fracture with nonunion: Secondary | ICD-10-CM | POA: Diagnosis not present

## 2020-06-21 DIAGNOSIS — M25632 Stiffness of left wrist, not elsewhere classified: Secondary | ICD-10-CM | POA: Diagnosis not present

## 2020-06-21 DIAGNOSIS — M25532 Pain in left wrist: Secondary | ICD-10-CM | POA: Diagnosis not present

## 2020-06-30 DIAGNOSIS — M25632 Stiffness of left wrist, not elsewhere classified: Secondary | ICD-10-CM | POA: Diagnosis not present

## 2020-06-30 DIAGNOSIS — M25532 Pain in left wrist: Secondary | ICD-10-CM | POA: Diagnosis not present

## 2020-06-30 DIAGNOSIS — R29898 Other symptoms and signs involving the musculoskeletal system: Secondary | ICD-10-CM | POA: Diagnosis not present

## 2020-07-07 DIAGNOSIS — M25632 Stiffness of left wrist, not elsewhere classified: Secondary | ICD-10-CM | POA: Diagnosis not present

## 2020-07-07 DIAGNOSIS — R29898 Other symptoms and signs involving the musculoskeletal system: Secondary | ICD-10-CM | POA: Diagnosis not present

## 2020-07-07 DIAGNOSIS — M25532 Pain in left wrist: Secondary | ICD-10-CM | POA: Diagnosis not present

## 2020-07-07 DIAGNOSIS — S62025K Nondisplaced fracture of middle third of navicular [scaphoid] bone of left wrist, subsequent encounter for fracture with nonunion: Secondary | ICD-10-CM | POA: Diagnosis not present

## 2020-07-13 DIAGNOSIS — M25632 Stiffness of left wrist, not elsewhere classified: Secondary | ICD-10-CM | POA: Diagnosis not present

## 2020-07-20 DIAGNOSIS — S62002K Unspecified fracture of navicular [scaphoid] bone of left wrist, subsequent encounter for fracture with nonunion: Secondary | ICD-10-CM | POA: Diagnosis not present

## 2020-07-20 DIAGNOSIS — M25532 Pain in left wrist: Secondary | ICD-10-CM | POA: Diagnosis not present

## 2020-07-20 DIAGNOSIS — M25632 Stiffness of left wrist, not elsewhere classified: Secondary | ICD-10-CM | POA: Diagnosis not present

## 2020-07-20 DIAGNOSIS — R29898 Other symptoms and signs involving the musculoskeletal system: Secondary | ICD-10-CM | POA: Diagnosis not present

## 2020-07-27 DIAGNOSIS — M25632 Stiffness of left wrist, not elsewhere classified: Secondary | ICD-10-CM | POA: Diagnosis not present

## 2020-08-02 DIAGNOSIS — S62025K Nondisplaced fracture of middle third of navicular [scaphoid] bone of left wrist, subsequent encounter for fracture with nonunion: Secondary | ICD-10-CM | POA: Diagnosis not present

## 2020-08-17 DIAGNOSIS — M25632 Stiffness of left wrist, not elsewhere classified: Secondary | ICD-10-CM | POA: Diagnosis not present

## 2020-08-17 DIAGNOSIS — R29898 Other symptoms and signs involving the musculoskeletal system: Secondary | ICD-10-CM | POA: Diagnosis not present

## 2020-08-17 DIAGNOSIS — M25532 Pain in left wrist: Secondary | ICD-10-CM | POA: Diagnosis not present

## 2020-08-17 DIAGNOSIS — S62002K Unspecified fracture of navicular [scaphoid] bone of left wrist, subsequent encounter for fracture with nonunion: Secondary | ICD-10-CM | POA: Diagnosis not present

## 2020-08-25 DIAGNOSIS — S62002K Unspecified fracture of navicular [scaphoid] bone of left wrist, subsequent encounter for fracture with nonunion: Secondary | ICD-10-CM | POA: Diagnosis not present

## 2020-08-25 DIAGNOSIS — R29898 Other symptoms and signs involving the musculoskeletal system: Secondary | ICD-10-CM | POA: Diagnosis not present

## 2020-08-25 DIAGNOSIS — M25632 Stiffness of left wrist, not elsewhere classified: Secondary | ICD-10-CM | POA: Diagnosis not present

## 2020-09-08 DIAGNOSIS — R29898 Other symptoms and signs involving the musculoskeletal system: Secondary | ICD-10-CM | POA: Diagnosis not present

## 2020-09-08 DIAGNOSIS — M25632 Stiffness of left wrist, not elsewhere classified: Secondary | ICD-10-CM | POA: Diagnosis not present

## 2020-09-08 DIAGNOSIS — M25532 Pain in left wrist: Secondary | ICD-10-CM | POA: Diagnosis not present

## 2020-09-08 DIAGNOSIS — S62002K Unspecified fracture of navicular [scaphoid] bone of left wrist, subsequent encounter for fracture with nonunion: Secondary | ICD-10-CM | POA: Diagnosis not present

## 2020-09-13 DIAGNOSIS — S62025K Nondisplaced fracture of middle third of navicular [scaphoid] bone of left wrist, subsequent encounter for fracture with nonunion: Secondary | ICD-10-CM | POA: Diagnosis not present

## 2020-09-29 ENCOUNTER — Other Ambulatory Visit: Payer: Self-pay | Admitting: Internal Medicine

## 2020-09-29 DIAGNOSIS — I10 Essential (primary) hypertension: Secondary | ICD-10-CM

## 2020-10-19 DIAGNOSIS — L308 Other specified dermatitis: Secondary | ICD-10-CM | POA: Diagnosis not present

## 2020-10-19 DIAGNOSIS — L218 Other seborrheic dermatitis: Secondary | ICD-10-CM | POA: Diagnosis not present

## 2020-11-24 ENCOUNTER — Other Ambulatory Visit: Payer: Self-pay | Admitting: Internal Medicine

## 2020-11-24 DIAGNOSIS — E785 Hyperlipidemia, unspecified: Secondary | ICD-10-CM

## 2020-12-31 ENCOUNTER — Other Ambulatory Visit: Payer: Self-pay | Admitting: Internal Medicine

## 2020-12-31 DIAGNOSIS — I10 Essential (primary) hypertension: Secondary | ICD-10-CM

## 2021-02-20 ENCOUNTER — Other Ambulatory Visit: Payer: Self-pay | Admitting: Internal Medicine

## 2021-02-20 DIAGNOSIS — E785 Hyperlipidemia, unspecified: Secondary | ICD-10-CM

## 2021-03-25 ENCOUNTER — Other Ambulatory Visit: Payer: Self-pay | Admitting: Internal Medicine

## 2021-03-25 DIAGNOSIS — E785 Hyperlipidemia, unspecified: Secondary | ICD-10-CM

## 2021-03-25 DIAGNOSIS — I10 Essential (primary) hypertension: Secondary | ICD-10-CM

## 2021-03-30 ENCOUNTER — Encounter: Payer: Self-pay | Admitting: Internal Medicine

## 2021-03-30 ENCOUNTER — Other Ambulatory Visit: Payer: Self-pay

## 2021-03-30 ENCOUNTER — Ambulatory Visit (INDEPENDENT_AMBULATORY_CARE_PROVIDER_SITE_OTHER): Payer: BC Managed Care – PPO | Admitting: Internal Medicine

## 2021-03-30 VITALS — BP 132/82 | HR 84 | Temp 98.2°F | Resp 16 | Ht 69.0 in | Wt 213.0 lb

## 2021-03-30 DIAGNOSIS — E781 Pure hyperglyceridemia: Secondary | ICD-10-CM

## 2021-03-30 DIAGNOSIS — Z23 Encounter for immunization: Secondary | ICD-10-CM | POA: Diagnosis not present

## 2021-03-30 DIAGNOSIS — E785 Hyperlipidemia, unspecified: Secondary | ICD-10-CM | POA: Diagnosis not present

## 2021-03-30 DIAGNOSIS — Z125 Encounter for screening for malignant neoplasm of prostate: Secondary | ICD-10-CM

## 2021-03-30 DIAGNOSIS — Z0001 Encounter for general adult medical examination with abnormal findings: Secondary | ICD-10-CM

## 2021-03-30 DIAGNOSIS — I10 Essential (primary) hypertension: Secondary | ICD-10-CM | POA: Diagnosis not present

## 2021-03-30 DIAGNOSIS — R7303 Prediabetes: Secondary | ICD-10-CM

## 2021-03-30 DIAGNOSIS — Z0000 Encounter for general adult medical examination without abnormal findings: Secondary | ICD-10-CM | POA: Diagnosis not present

## 2021-03-30 LAB — LIPID PANEL
Cholesterol: 163 mg/dL (ref 0–200)
HDL: 45.1 mg/dL (ref >39.00)
LDL Cholesterol: 94 mg/dL (ref 0–99)
NonHDL: 117.97
Total CHOL/HDL Ratio: 4
Triglycerides: 119 mg/dL (ref 0.0–149.0)
VLDL: 23.8 mg/dL (ref 0.0–40.0)

## 2021-03-30 LAB — HEPATIC FUNCTION PANEL
ALT: 22 U/L (ref 0–53)
AST: 16 U/L (ref 0–37)
Albumin: 4.5 g/dL (ref 3.5–5.2)
Alkaline Phosphatase: 56 U/L (ref 39–117)
Bilirubin, Direct: 0.1 mg/dL (ref 0.0–0.3)
Total Bilirubin: 0.6 mg/dL (ref 0.2–1.2)
Total Protein: 7.5 g/dL (ref 6.0–8.3)

## 2021-03-30 LAB — CBC WITH DIFFERENTIAL/PLATELET
Basophils Absolute: 0 10*3/uL (ref 0.0–0.1)
Basophils Relative: 0.6 % (ref 0.0–3.0)
Eosinophils Absolute: 0.2 10*3/uL (ref 0.0–0.7)
Eosinophils Relative: 3.9 % (ref 0.0–5.0)
HCT: 43.3 % (ref 39.0–52.0)
Hemoglobin: 14.3 g/dL (ref 13.0–17.0)
Lymphocytes Relative: 31.2 % (ref 12.0–46.0)
Lymphs Abs: 1.9 10*3/uL (ref 0.7–4.0)
MCHC: 33.1 g/dL (ref 30.0–36.0)
MCV: 92.1 fl (ref 78.0–100.0)
Monocytes Absolute: 0.6 10*3/uL (ref 0.1–1.0)
Monocytes Relative: 9.5 % (ref 3.0–12.0)
Neutro Abs: 3.4 10*3/uL (ref 1.4–7.7)
Neutrophils Relative %: 54.8 % (ref 43.0–77.0)
Platelets: 247 10*3/uL (ref 150.0–400.0)
RBC: 4.7 Mil/uL (ref 4.22–5.81)
RDW: 12.5 % (ref 11.5–15.5)
WBC: 6.2 10*3/uL (ref 4.0–10.5)

## 2021-03-30 LAB — URINALYSIS, ROUTINE W REFLEX MICROSCOPIC
Bilirubin Urine: NEGATIVE
Hgb urine dipstick: NEGATIVE
Ketones, ur: NEGATIVE
Leukocytes,Ua: NEGATIVE
Nitrite: NEGATIVE
Specific Gravity, Urine: 1.01 (ref 1.000–1.030)
Total Protein, Urine: NEGATIVE
Urine Glucose: NEGATIVE
Urobilinogen, UA: 0.2 (ref 0.0–1.0)
WBC, UA: NONE SEEN (ref 0–?)
pH: 6 (ref 5.0–8.0)

## 2021-03-30 LAB — BASIC METABOLIC PANEL
BUN: 17 mg/dL (ref 6–23)
CO2: 25 mEq/L (ref 19–32)
Calcium: 9.8 mg/dL (ref 8.4–10.5)
Chloride: 102 mEq/L (ref 96–112)
Creatinine, Ser: 0.97 mg/dL (ref 0.40–1.50)
GFR: 87.66 mL/min (ref >60.00)
Glucose, Bld: 103 mg/dL — ABNORMAL HIGH (ref 70–99)
Potassium: 4.7 mEq/L (ref 3.5–5.1)
Sodium: 139 mEq/L (ref 135–145)

## 2021-03-30 LAB — HEMOGLOBIN A1C: Hgb A1c MFr Bld: 6.2 % (ref 4.6–6.5)

## 2021-03-30 LAB — TSH: TSH: 2.54 u[IU]/mL (ref 0.35–5.50)

## 2021-03-30 LAB — PSA: PSA: 2.6 ng/mL (ref 0.10–4.00)

## 2021-03-30 MED ORDER — IRBESARTAN 150 MG PO TABS: 150.0000 mg | ORAL_TABLET | Freq: Every day | ORAL | 1 refills | Status: DC

## 2021-03-30 MED ORDER — ROSUVASTATIN CALCIUM 20 MG PO TABS: 20.0000 mg | ORAL_TABLET | Freq: Every day | ORAL | 1 refills | Status: DC

## 2021-03-30 NOTE — Progress Notes (Signed)
Subjective:  Patient ID: Joel Wade, male    DOB: Dec 26, 1965  Age: 56 y.o. MRN: [id number]  CC: Annual Exam, Hypertension, and Hyperlipidemia  This visit occurred during the SARS-CoV-2 public health emergency.  Safety protocols were in place, including screening questions prior to the visit, additional usage of staff PPE, and extensive cleaning of exam room while observing appropriate contact time as indicated for disinfecting solutions.    HPI Dehaven Sine Fier presents for a CPX and f/up -   He has a new godchild and was told to get a Tdap to prevent whooping cough.  He is active and denies chest pain, shortness of breath, diaphoresis, dizziness, lightheadedness, edema, or fatigue.  Outpatient Medications Prior to Visit  Medication Sig Dispense Refill   cetirizine (ZYRTEC) 5 MG tablet Take 5 mg by mouth daily.     aspirin EC 81 MG tablet Take 1 tablet (81 mg total) by mouth daily. 90 tablet 3   irbesartan (AVAPRO) 150 MG tablet TAKE 1 TABLET BY MOUTH EVERY DAY 90 tablet 0   rosuvastatin (CRESTOR) 20 MG tablet TAKE 1 TABLET BY MOUTH EVERY DAY 90 tablet 0   No facility-administered medications prior to visit.    ROS Review of Systems  Constitutional:  Negative for chills, diaphoresis, fatigue and fever.  HENT: Negative.    Eyes: Negative.   Respiratory:  Negative for cough, shortness of breath and wheezing.   Cardiovascular:  Negative for chest pain, palpitations and leg swelling.  Gastrointestinal:  Negative for abdominal pain, constipation, diarrhea, nausea and vomiting.  Genitourinary: Negative.  Negative for difficulty urinating, dysuria and hematuria.  Musculoskeletal:  Negative for arthralgias, joint swelling and myalgias.  Skin: Negative.  Negative for color change and pallor.  Neurological:  Negative for dizziness, weakness, light-headedness and headaches.  Hematological:  Negative for adenopathy. Does not bruise/bleed easily.  Psychiatric/Behavioral: Negative.      Objective:  BP 132/82 (BP Location: Left Arm, Patient Position: Sitting, Cuff Size: Large)    Pulse 84    Temp 98.2 F (36.8 C) (Oral)    Resp 16    Ht 5\' 9"  (1.753 m)    Wt 213 lb (96.6 kg)    SpO2 95%    BMI 31.45 kg/m   BP Readings from Last 3 Encounters:  03/30/21 132/82  01/26/20 132/82  10/13/19 134/88    Wt Readings from Last 3 Encounters:  03/30/21 213 lb (96.6 kg)  01/26/20 228 lb (103.4 kg)  10/23/19 208 lb 9.6 oz (94.6 kg)    Physical Exam Vitals reviewed.  HENT:     Nose: Nose normal.     Mouth/Throat:     Mouth: Mucous membranes are moist.  Eyes:     General: No scleral icterus.    Conjunctiva/sclera: Conjunctivae normal.  Cardiovascular:     Rate and Rhythm: Normal rate and regular rhythm.     Heart sounds: No murmur heard. Pulmonary:     Effort: Pulmonary effort is normal.     Breath sounds: No stridor. No wheezing, rhonchi or rales.  Abdominal:     General: There is no distension.     Palpations: There is no mass.     Tenderness: There is no abdominal tenderness. There is no guarding or rebound.     Hernia: No hernia is present. There is no hernia in the left inguinal area or right inguinal area.  Genitourinary:    Pubic Area: No rash.      Penis: Normal and  circumcised.      Testes: Normal.     Epididymis:     Right: Normal.     Left: Normal.     Prostate: Normal. Not enlarged, not tender and no nodules present.     Rectum: Normal. Guaiac result negative. No mass, tenderness, anal fissure, external hemorrhoid or internal hemorrhoid. Normal anal tone.  Musculoskeletal:        General: Normal range of motion.     Cervical back: Neck supple.     Right lower leg: No edema.     Left lower leg: No edema.  Lymphadenopathy:     Cervical: No cervical adenopathy.     Lower Body: No right inguinal adenopathy. No left inguinal adenopathy.  Skin:    General: Skin is warm and dry.  Neurological:     General: No focal deficit present.     Mental Status:  He is alert.  Psychiatric:        Mood and Affect: Mood normal.        Behavior: Behavior normal.    Lab Results  Component Value Date   WBC 6.2 03/30/2021   HGB 14.3 03/30/2021   HCT 43.3 03/30/2021   PLT 247.0 03/30/2021   GLUCOSE 103 (H) 03/30/2021   CHOL 163 03/30/2021   TRIG 119.0 03/30/2021   HDL 45.10 03/30/2021   LDLDIRECT 218.0 12/24/2018   LDLCALC 94 03/30/2021   ALT 22 03/30/2021   AST 16 03/30/2021   NA 139 03/30/2021   K 4.7 03/30/2021   CL 102 03/30/2021   CREATININE 0.97 03/30/2021   BUN 17 03/30/2021   CO2 25 03/30/2021   TSH 2.54 03/30/2021   PSA 2.60 03/30/2021   HGBA1C 6.2 03/30/2021    MR WRIST LEFT WO CONTRAST  Result Date: 05/13/2020 CLINICAL DATA:  Patient suffered a fracture of the scaphoid of the left wrist in May, 2021. Continued left wrist pain and weakness. EXAM: MR OF THE LEFT WRIST WITHOUT CONTRAST TECHNIQUE: Multiplanar, multisequence MR imaging of the left wrist was performed. No intravenous contrast was administered. COMPARISON:  CT left wrist 05/04/2019 and 11/12/2019 FINDINGS: Ligaments: Appear intact. Triangular fibrocartilage: Intact. Tendons: Intact and normal in appearance. Carpal tunnel/median nerve: Normal. Guyon's canal: Normal. Joint/cartilage: Mild first CMC osteoarthritis noted. Bones/carpal alignment: Again seen is a fracture through junction of the middle and distal thirds of the scaphoid. Small foci of bridging bone about the fracture seen on prior CT are difficult to visualize on this examination. There is intermediate increased T2 signal and corresponding decreased T1 throughout are seen almost the entire scaphoid. Along the inferior aspect of the proximal pole, there is a greater degree of T1 hypointensity. Markedly decreased T1 and T2 signal about the fracture margins is compatible with sclerosis. Dorsal tilt of the lunate measures approximately 33 degrees on this study. Other: No fluid collection or mass. IMPRESSION:  Redemonstration of a fracture through the waist of the scaphoid. There is mildly increased T2 and decreased T1 signal throughout the scaphoid. More marked T1 hypointensity along the inferior aspect of the proximal pole is noted and could be due to avascular necrosis. There is mild dorsal tilt of the lunate on this study. Assessment for DISI is better performed with plain films. Intact TFC, tendons and ligaments. Electronically Signed   By: Inge Rise M.D.   On: 05/13/2020 14:37    Assessment & Plan:   Clebert was seen today for annual exam, hypertension and hyperlipidemia.  Diagnoses and all orders for this  visit:  Prediabetes- His A1c is up to 6.2%.  Will continue lifestyle modifications. -     Basic metabolic panel; Future -     Hemoglobin A1c; Future -     Hemoglobin A1c -     Basic metabolic panel  Essential hypertension- His blood pressure is adequately well controlled. -     Basic metabolic panel; Future -     TSH; Future -     Urinalysis, Routine w reflex microscopic; Future -     CBC with Differential/Platelet; Future -     CBC with Differential/Platelet -     Urinalysis, Routine w reflex microscopic -     TSH -     Basic metabolic panel -     irbesartan (AVAPRO) 150 MG tablet; Take 1 tablet (150 mg total) by mouth daily.  Hyperlipidemia with target LDL less than 130- LDL goal achieved. Doing well on the statin  -     TSH; Future -     Hepatic function panel; Future -     Hepatic function panel -     TSH -     rosuvastatin (CRESTOR) 20 MG tablet; Take 1 tablet (20 mg total) by mouth daily.  Pure hypertriglyceridemia- His triglycerides are normal now. -     Hepatic function panel; Future -     Hepatic function panel  Encounter for general adult medical examination with abnormal findings- Exam completed, labs reviewed, vaccines reviewed and updated, cancer screenings are up-to-date, patient education was given. -     Lipid panel; Future -     PSA; Future -     PSA -      Lipid panel  Need for Tdap vaccination -     Tdap vaccine greater than or equal to 7yo IM   I have discontinued Audrie Gallus. [place]'s aspirin EC. I have also changed his irbesartan and rosuvastatin. Additionally, I am having him maintain his cetirizine.  Meds ordered this encounter  Medications   irbesartan (AVAPRO) 150 MG tablet    Sig: Take 1 tablet (150 mg total) by mouth daily.    Dispense:  90 tablet    Refill:  1   rosuvastatin (CRESTOR) 20 MG tablet    Sig: Take 1 tablet (20 mg total) by mouth daily.    Dispense:  90 tablet    Refill:  1     Follow-up: Return in about 6 months (around 09/27/2021).  Scarlette Calico, MD

## 2021-03-30 NOTE — Patient Instructions (Signed)

## 2021-05-04 DIAGNOSIS — L258 Unspecified contact dermatitis due to other agents: Secondary | ICD-10-CM | POA: Diagnosis not present

## 2021-05-04 DIAGNOSIS — L218 Other seborrheic dermatitis: Secondary | ICD-10-CM | POA: Diagnosis not present

## 2021-06-27 ENCOUNTER — Other Ambulatory Visit: Payer: Self-pay | Admitting: Internal Medicine

## 2021-06-27 DIAGNOSIS — I10 Essential (primary) hypertension: Secondary | ICD-10-CM

## 2021-09-21 ENCOUNTER — Telehealth: Payer: Self-pay

## 2021-09-21 ENCOUNTER — Encounter: Payer: Self-pay | Admitting: Internal Medicine

## 2021-09-21 ENCOUNTER — Ambulatory Visit: Payer: Managed Care, Other (non HMO) | Admitting: Internal Medicine

## 2021-09-21 VITALS — BP 124/84 | HR 80 | Temp 98.0°F | Resp 16 | Ht 69.0 in | Wt 197.0 lb

## 2021-09-21 DIAGNOSIS — N5201 Erectile dysfunction due to arterial insufficiency: Secondary | ICD-10-CM | POA: Diagnosis not present

## 2021-09-21 DIAGNOSIS — R0609 Other forms of dyspnea: Secondary | ICD-10-CM

## 2021-09-21 DIAGNOSIS — I10 Essential (primary) hypertension: Secondary | ICD-10-CM | POA: Diagnosis not present

## 2021-09-21 DIAGNOSIS — R7303 Prediabetes: Secondary | ICD-10-CM | POA: Diagnosis not present

## 2021-09-21 DIAGNOSIS — T63441A Toxic effect of venom of bees, accidental (unintentional), initial encounter: Secondary | ICD-10-CM

## 2021-09-21 DIAGNOSIS — R9431 Abnormal electrocardiogram [ECG] [EKG]: Secondary | ICD-10-CM | POA: Diagnosis not present

## 2021-09-21 DIAGNOSIS — E785 Hyperlipidemia, unspecified: Secondary | ICD-10-CM

## 2021-09-21 LAB — BASIC METABOLIC PANEL
BUN: 19 mg/dL (ref 6–23)
CO2: 26 mEq/L (ref 19–32)
Calcium: 9.6 mg/dL (ref 8.4–10.5)
Chloride: 101 mEq/L (ref 96–112)
Creatinine, Ser: 1.01 mg/dL (ref 0.40–1.50)
GFR: 83.23 mL/min (ref >60.00)
Glucose, Bld: 108 mg/dL — ABNORMAL HIGH (ref 70–99)
Potassium: 4.3 mEq/L (ref 3.5–5.1)
Sodium: 135 mEq/L (ref 135–145)

## 2021-09-21 LAB — HEMOGLOBIN A1C: Hgb A1c MFr Bld: 5.9 % (ref 4.6–6.5)

## 2021-09-21 LAB — TROPONIN I (HIGH SENSITIVITY): High Sens Troponin I: 3 ng/L (ref 2–17)

## 2021-09-21 LAB — PROLACTIN: Prolactin: 3.4 ng/mL (ref 2.0–18.0)

## 2021-09-21 LAB — TESTOSTERONE TOTAL,FREE,BIO, MALES
Albumin: 4.6 g/dL (ref 3.6–5.1)
Sex Hormone Binding: 28 nmol/L (ref 22–77)
Testosterone, Bioavailable: 174.6 ng/dL (ref 110.0–575.0)
Testosterone, Free: 83.1 pg/mL (ref 46.0–224.0)
Testosterone: 536 ng/dL (ref 250–827)

## 2021-09-21 LAB — BRAIN NATRIURETIC PEPTIDE: Pro B Natriuretic peptide (BNP): 10 pg/mL (ref 0.0–100.0)

## 2021-09-21 MED ORDER — EPINEPHRINE 0.3 MG/0.3ML IJ SOAJ: 0.3000 mg | INTRAMUSCULAR | 3 refills | Status: AC | PRN

## 2021-09-21 MED ORDER — SILDENAFIL CITRATE 20 MG PO TABS: 80.0000 mg | ORAL_TABLET | Freq: Every day | ORAL | 3 refills | Status: DC | PRN

## 2021-09-21 MED ORDER — METHYLPREDNISOLONE 4 MG PO TBPK: ORAL_TABLET | ORAL | 0 refills | Status: AC

## 2021-09-21 NOTE — Progress Notes (Signed)
Subjective:  Patient ID: Joel Wade, male    DOB: 1965-10-29  Age: 56 y.o. MRN: [id number]  CC: Hypertension   HPI OBERT ESPINDOLA presents for f/up -  He was vacationing in the [place] about 2 months ago and when he climbed some steps he developed dyspnea on exertion.  He also recently got stung by bee on his right forearm and has pain and swelling.  He has not gotten much symptom relief with Benadryl.  Additionally, over the last few months he complains that he has developed erectile dysfunction with difficulty maintaining an erection.  He says his libido is pretty good.  Outpatient Medications Prior to Visit  Medication Sig Dispense Refill   cetirizine (ZYRTEC) 5 MG tablet Take 5 mg by mouth daily.     irbesartan (AVAPRO) 150 MG tablet TAKE 1 TABLET BY MOUTH EVERY DAY 90 tablet 1   rosuvastatin (CRESTOR) 20 MG tablet Take 1 tablet (20 mg total) by mouth daily. 90 tablet 1   No facility-administered medications prior to visit.    ROS Review of Systems  Constitutional: Negative.  Negative for diaphoresis and fatigue.  HENT: Negative.    Eyes: Negative.   Respiratory:  Positive for shortness of breath. Negative for cough, chest tightness and wheezing.   Cardiovascular:  Negative for chest pain, palpitations and leg swelling.  Gastrointestinal:  Negative for abdominal pain.  Endocrine: Negative.   Genitourinary: Negative.  Negative for difficulty urinating.  Musculoskeletal: Negative.   Skin:  Positive for color change. Negative for rash.  Neurological: Negative.  Negative for dizziness, weakness, light-headedness and numbness.  Hematological:  Negative for adenopathy. Does not bruise/bleed easily.  Psychiatric/Behavioral: Negative.      Objective:  BP 124/84 (BP Location: Left Arm, Patient Position: Sitting, Cuff Size: Large)   Pulse 80   Temp 98 F (36.7 C) (Oral)   Resp 16   Ht '5\' 9"'$  (1.753 m)   Wt 197 lb (89.4 kg)   SpO2 97%   BMI 29.09 kg/m   BP  Readings from Last 3 Encounters:  09/21/21 124/84  03/30/21 132/82  01/26/20 132/82    Wt Readings from Last 3 Encounters:  09/21/21 197 lb (89.4 kg)  03/30/21 213 lb (96.6 kg)  01/26/20 228 lb (103.4 kg)    Physical Exam Vitals reviewed.  HENT:     Nose: Nose normal.     Mouth/Throat:     Mouth: Mucous membranes are moist.  Eyes:     General: No scleral icterus.    Conjunctiva/sclera: Conjunctivae normal.  Cardiovascular:     Rate and Rhythm: Normal rate and regular rhythm.     Pulses:          Carotid pulses are 1+ on the right side and 1+ on the left side.      Radial pulses are 1+ on the right side and 1+ on the left side.       Femoral pulses are 1+ on the right side and 1+ on the left side.      Popliteal pulses are 1+ on the right side and 1+ on the left side.       Dorsalis pedis pulses are 1+ on the right side and 1+ on the left side.       Posterior tibial pulses are 1+ on the right side and 1+ on the left side.     Heart sounds: Normal heart sounds, S1 normal and S2 normal. No murmur heard.  No friction rub. No gallop.     Comments: EKG- NSR, 78 bpm ?LAE RBBB,LVH No Q waves unchanged Pulmonary:     Effort: Pulmonary effort is normal.     Breath sounds: No stridor. No wheezing, rhonchi or rales.  Abdominal:     General: Abdomen is flat.     Palpations: There is no mass.     Tenderness: There is no abdominal tenderness. There is no guarding.     Hernia: No hernia is present.  Musculoskeletal:     Cervical back: Neck supple.     Right lower leg: No edema.     Left lower leg: No edema.     Comments: Extending from the right elbow down to the right mid dorsum of the forearm there is diffuse erythema and swelling.  There are no wounds, pustules, or vesicles.    Lymphadenopathy:     Cervical: No cervical adenopathy.  Skin:    General: Skin is warm and dry.  Neurological:     General: No focal deficit present.     Mental Status: He is alert.   Psychiatric:        Mood and Affect: Mood normal.        Behavior: Behavior normal.     Lab Results  Component Value Date   WBC 6.2 03/30/2021   HGB 14.3 03/30/2021   HCT 43.3 03/30/2021   PLT 247.0 03/30/2021   GLUCOSE 108 (H) 09/21/2021   CHOL 163 03/30/2021   TRIG 119.0 03/30/2021   HDL 45.10 03/30/2021   LDLDIRECT 218.0 12/24/2018   LDLCALC 94 03/30/2021   ALT 22 03/30/2021   AST 16 03/30/2021   NA 135 09/21/2021   K 4.3 09/21/2021   CL 101 09/21/2021   CREATININE 1.01 09/21/2021   BUN 19 09/21/2021   CO2 26 09/21/2021   TSH 2.54 03/30/2021   PSA 2.60 03/30/2021   HGBA1C 5.9 09/21/2021    MR WRIST LEFT WO CONTRAST  Result Date: 05/13/2020 CLINICAL DATA:  Patient suffered a fracture of the scaphoid of the left wrist in May, 2021. Continued left wrist pain and weakness. EXAM: MR OF THE LEFT WRIST WITHOUT CONTRAST TECHNIQUE: Multiplanar, multisequence MR imaging of the left wrist was performed. No intravenous contrast was administered. COMPARISON:  CT left wrist 05/04/2019 and 11/12/2019 FINDINGS: Ligaments: Appear intact. Triangular fibrocartilage: Intact. Tendons: Intact and normal in appearance. Carpal tunnel/median nerve: Normal. Guyon's canal: Normal. Joint/cartilage: Mild first CMC osteoarthritis noted. Bones/carpal alignment: Again seen is a fracture through junction of the middle and distal thirds of the scaphoid. Small foci of bridging bone about the fracture seen on prior CT are difficult to visualize on this examination. There is intermediate increased T2 signal and corresponding decreased T1 throughout are seen almost the entire scaphoid. Along the inferior aspect of the proximal pole, there is a greater degree of T1 hypointensity. Markedly decreased T1 and T2 signal about the fracture margins is compatible with sclerosis. Dorsal tilt of the lunate measures approximately 33 degrees on this study. Other: No fluid collection or mass. IMPRESSION: Redemonstration of a  fracture through the waist of the scaphoid. There is mildly increased T2 and decreased T1 signal throughout the scaphoid. More marked T1 hypointensity along the inferior aspect of the proximal pole is noted and could be due to avascular necrosis. There is mild dorsal tilt of the lunate on this study. Assessment for DISI is better performed with plain films. Intact TFC, tendons and ligaments. Electronically Signed   By:  Inge Rise M.D.   On: 05/13/2020 14:37    01/01/2019 -  Nuclear stress EF: 59%. No wall motion abnormalities. There was no ST segment deviation noted during stress. The study is normal. This is a low risk study. No ischemia identified.   Candee Furbish, MD   Assessment & Plan:   Athen was seen today for hypertension.  Diagnoses and all orders for this visit:  Allergic reaction to bee sting- Will treat with a 6-day course of methylprednisolone. -     methylPREDNISolone (MEDROL DOSEPAK) 4 MG TBPK tablet; TAKE AS DIRECTED -     EPINEPHrine 0.3 mg/0.3 mL IJ SOAJ injection; Inject 0.3 mg into the muscle as needed for anaphylaxis.  DOE (dyspnea on exertion)- His labs today are reassuring.  I recommended a coronary calcium score to gauge his risk for coronary artery disease.  He also has LVH so I recommended that he undergo an echocardiogram to evaluate for structural heart disease. -     CT CARDIAC SCORING (SELF PAY ONLY); Future -     Troponin I (High Sensitivity); Future -     Brain natriuretic peptide; Future -     ECHOCARDIOGRAM COMPLETE; Future -     Brain natriuretic peptide -     Troponin I (High Sensitivity)  Erectile dysfunction due to arterial insufficiency- His testosterone level is normal.  Will treat with a PDE 5 inhibitor. -     Testosterone Total,Free,Bio, Males; Future -     Prolactin; Future -     sildenafil (REVATIO) 20 MG tablet; Take 4 tablets (80 mg total) by mouth daily as needed. -     Prolactin -     Testosterone Total,Free,Bio,  Males  Essential hypertension- His blood pressure is adequately well controlled. -     Basic metabolic panel; Future -     Basic metabolic panel  Prediabetes- His A1c is at 5.9%. -     Hemoglobin A1c; Future -     Hemoglobin A1c  Abnormal electrocardiogram- See above. -     CT CARDIAC SCORING (SELF PAY ONLY); Future -     Troponin I (High Sensitivity); Future -     Brain natriuretic peptide; Future -     ECHOCARDIOGRAM COMPLETE; Future -     Brain natriuretic peptide -     Troponin I (High Sensitivity)  Hyperlipidemia with target LDL less than 130 -     rosuvastatin (CRESTOR) 20 MG tablet; Take 1 tablet (20 mg total) by mouth daily.   I am having Audrie Gallus. Sieling start on methylPREDNISolone, EPINEPHrine, and sildenafil. I am also having him maintain his cetirizine, irbesartan, and rosuvastatin.  Meds ordered this encounter  Medications   methylPREDNISolone (MEDROL DOSEPAK) 4 MG TBPK tablet    Sig: TAKE AS DIRECTED    Dispense:  21 tablet    Refill:  0   EPINEPHrine 0.3 mg/0.3 mL IJ SOAJ injection    Sig: Inject 0.3 mg into the muscle as needed for anaphylaxis.    Dispense:  2 each    Refill:  3   sildenafil (REVATIO) 20 MG tablet    Sig: Take 4 tablets (80 mg total) by mouth daily as needed.    Dispense:  60 tablet    Refill:  3   rosuvastatin (CRESTOR) 20 MG tablet    Sig: Take 1 tablet (20 mg total) by mouth daily.    Dispense:  90 tablet    Refill:  1   I spent  50 minutes in preparing to see the patient by review of recent labs and procedures, obtaining and reviewing separately obtained history, communicating with the patient, ordering medications, tests or procedures, and documenting clinical information in the EHR including the differential Dx, treatment, and any further evaluation and management of multiple complex medical issues.     Follow-up: Return in about 6 months (around 03/24/2022).  Scarlette Calico, MD

## 2021-09-21 NOTE — Telephone Encounter (Signed)
Key: [id number]

## 2021-09-21 NOTE — Patient Instructions (Signed)
Hypertension, Adult High blood pressure (hypertension) is when the force of blood pumping through the arteries is too strong. The arteries are the blood vessels that carry blood from the heart throughout the body. Hypertension forces the heart to work harder to pump blood and may cause arteries to become narrow or stiff. Untreated or uncontrolled hypertension can lead to a heart attack, heart failure, a stroke, kidney disease, and other problems. A blood pressure reading consists of a higher number over a lower number. Ideally, your blood pressure should be below 120/80. The first ("top") number is called the systolic pressure. It is a measure of the pressure in your arteries as your heart beats. The second ("bottom") number is called the diastolic pressure. It is a measure of the pressure in your arteries as the heart relaxes. What are the causes? The exact cause of this condition is not known. There are some conditions that result in high blood pressure. What increases the risk? Certain factors may make you more likely to develop high blood pressure. Some of these risk factors are under your control, including: Smoking. Not getting enough exercise or physical activity. Being overweight. Having too much fat, sugar, calories, or salt (sodium) in your diet. Drinking too much alcohol. Other risk factors include: Having a personal history of heart disease, diabetes, high cholesterol, or kidney disease. Stress. Having a family history of high blood pressure and high cholesterol. Having obstructive sleep apnea. Age. The risk increases with age. What are the signs or symptoms? High blood pressure may not cause symptoms. Very high blood pressure (hypertensive crisis) may cause: Headache. Fast or irregular heartbeats (palpitations). Shortness of breath. Nosebleed. Nausea and vomiting. Vision changes. Severe chest pain, dizziness, and seizures. How is this diagnosed? This condition is diagnosed by  measuring your blood pressure while you are seated, with your arm resting on a flat surface, your legs uncrossed, and your feet flat on the floor. The cuff of the blood pressure monitor will be placed directly against the skin of your upper arm at the level of your heart. Blood pressure should be measured at least twice using the same arm. Certain conditions can cause a difference in blood pressure between your right and left arms. If you have a high blood pressure reading during one visit or you have normal blood pressure with other risk factors, you may be asked to: Return on a different day to have your blood pressure checked again. Monitor your blood pressure at home for 1 week or longer. If you are diagnosed with hypertension, you may have other blood or imaging tests to help your health care provider understand your overall risk for other conditions. How is this treated? This condition is treated by making healthy lifestyle changes, such as eating healthy foods, exercising more, and reducing your alcohol intake. You may be referred for counseling on a healthy diet and physical activity. Your health care provider may prescribe medicine if lifestyle changes are not enough to get your blood pressure under control and if: Your systolic blood pressure is above 130. Your diastolic blood pressure is above 80. Your personal target blood pressure may vary depending on your medical conditions, your age, and other factors. Follow these instructions at home: Eating and drinking  Eat a diet that is high in fiber and potassium, and low in sodium, added sugar, and fat. An example of this eating plan is called the DASH diet. DASH stands for Dietary Approaches to Stop Hypertension. To eat this way: Eat   plenty of fresh fruits and vegetables. Try to fill one half of your plate at each meal with fruits and vegetables. Eat whole grains, such as whole-wheat pasta, brown rice, or whole-grain bread. Fill about one  fourth of your plate with whole grains. Eat or drink low-fat dairy products, such as skim milk or low-fat yogurt. Avoid fatty cuts of meat, processed or cured meats, and poultry with skin. Fill about one fourth of your plate with lean proteins, such as fish, chicken without skin, beans, eggs, or tofu. Avoid pre-made and processed foods. These tend to be higher in sodium, added sugar, and fat. Reduce your daily sodium intake. Many people with hypertension should eat less than 1,500 mg of sodium a day. Do not drink alcohol if: Your health care provider tells you not to drink. You are pregnant, may be pregnant, or are planning to become pregnant. If you drink alcohol: Limit how much you have to: 0-1 drink a day for women. 0-2 drinks a day for men. Know how much alcohol is in your drink. In the [place] one drink equals one 12 oz bottle of beer (355 mL), one 5 oz glass of wine (148 mL), or one 1 oz glass of hard liquor (44 mL). Lifestyle  Work with your health care provider to maintain a healthy body weight or to lose weight. Ask what an ideal weight is for you. Get at least 30 minutes of exercise that causes your heart to beat faster (aerobic exercise) most days of the week. Activities may include walking, swimming, or biking. Include exercise to strengthen your muscles (resistance exercise), such as Pilates or lifting weights, as part of your weekly exercise routine. Try to do these types of exercises for 30 minutes at least 3 days a week. Do not use any products that contain nicotine or tobacco. These products include cigarettes, chewing tobacco, and vaping devices, such as e-cigarettes. If you need help quitting, ask your health care provider. Monitor your blood pressure at home as told by your health care provider. Keep all follow-up visits. This is important. Medicines Take over-the-counter and prescription medicines only as told by your health care provider. Follow directions carefully. Blood  pressure medicines must be taken as prescribed. Do not skip doses of blood pressure medicine. Doing this puts you at risk for problems and can make the medicine less effective. Ask your health care provider about side effects or reactions to medicines that you should watch for. Contact a health care provider if you: Think you are having a reaction to a medicine you are taking. Have headaches that keep coming back (recurring). Feel dizzy. Have swelling in your ankles. Have trouble with your vision. Get help right away if you: Develop a severe headache or confusion. Have unusual weakness or numbness. Feel faint. Have severe pain in your chest or abdomen. Vomit repeatedly. Have trouble breathing. These symptoms may be an emergency. Get help right away. Call 911. Do not wait to see if the symptoms will go away. Do not drive yourself to the hospital. Summary Hypertension is when the force of blood pumping through your arteries is too strong. If this condition is not controlled, it may put you at risk for serious complications. Your personal target blood pressure may vary depending on your medical conditions, your age, and other factors. For most people, a normal blood pressure is less than 120/80. Hypertension is treated with lifestyle changes, medicines, or a combination of both. Lifestyle changes include losing weight, eating a healthy,   low-sodium diet, exercising more, and limiting alcohol. This information is not intended to replace advice given to you by your health care provider. Make sure you discuss any questions you have with your health care provider. Document Revised: 12/14/2020 Document Reviewed: 12/14/2020 Elsevier Patient Education  2023 [employer]  

## 2021-09-22 ENCOUNTER — Other Ambulatory Visit: Payer: Self-pay | Admitting: Internal Medicine

## 2021-09-22 DIAGNOSIS — E785 Hyperlipidemia, unspecified: Secondary | ICD-10-CM

## 2021-09-22 MED ORDER — ROSUVASTATIN CALCIUM 20 MG PO TABS: 20.0000 mg | ORAL_TABLET | Freq: Every day | ORAL | 1 refills | Status: DC

## 2021-09-29 ENCOUNTER — Other Ambulatory Visit: Payer: Self-pay | Admitting: Internal Medicine

## 2021-09-29 ENCOUNTER — Encounter: Payer: Self-pay | Admitting: Internal Medicine

## 2021-09-29 DIAGNOSIS — N5201 Erectile dysfunction due to arterial insufficiency: Secondary | ICD-10-CM

## 2021-09-29 MED ORDER — SILDENAFIL CITRATE 20 MG PO TABS: 80.0000 mg | ORAL_TABLET | Freq: Every day | ORAL | 3 refills | Status: DC | PRN

## 2021-10-03 NOTE — Telephone Encounter (Signed)
Per CoverMyMeds: ? ?PA was denied.  ?

## 2021-10-05 NOTE — Addendum Note (Signed)
Addended by: Henrene Dodge on: 10/05/2021 04:14 PM   Modules accepted: Orders

## 2021-10-12 ENCOUNTER — Ambulatory Visit (HOSPITAL_BASED_OUTPATIENT_CLINIC_OR_DEPARTMENT_OTHER)
Admission: RE | Admit: 2021-10-12 | Discharge: 2021-10-12 | Disposition: A | Discharge status: Home / Self Care | Payer: Managed Care, Other (non HMO) | Source: Ambulatory Visit | Attending: Internal Medicine | Admitting: Internal Medicine

## 2021-10-12 DIAGNOSIS — R9431 Abnormal electrocardiogram [ECG] [EKG]: Secondary | ICD-10-CM | POA: Insufficient documentation

## 2021-10-12 DIAGNOSIS — R0609 Other forms of dyspnea: Secondary | ICD-10-CM | POA: Insufficient documentation

## 2021-10-14 ENCOUNTER — Encounter: Payer: Self-pay | Admitting: Internal Medicine

## 2021-12-18 ENCOUNTER — Other Ambulatory Visit: Payer: Self-pay | Admitting: Internal Medicine

## 2021-12-18 DIAGNOSIS — I10 Essential (primary) hypertension: Secondary | ICD-10-CM

## 2022-01-19 ENCOUNTER — Telehealth (HOSPITAL_BASED_OUTPATIENT_CLINIC_OR_DEPARTMENT_OTHER): Payer: Self-pay | Admitting: *Deleted

## 2022-01-19 NOTE — Telephone Encounter (Signed)
We have attempted to contact the ordering providers office to obtain a prior authorization for the ordered test.  However, we have been unsuccesful. Order will be removed from the active order WQ. Once the prior authorization is obtained we will reinstate the order and schedule patient.   Thank you

## 2022-02-23 ENCOUNTER — Other Ambulatory Visit: Payer: Self-pay | Admitting: Internal Medicine

## 2022-02-23 DIAGNOSIS — E785 Hyperlipidemia, unspecified: Secondary | ICD-10-CM

## 2022-06-16 ENCOUNTER — Other Ambulatory Visit: Payer: Self-pay | Admitting: Internal Medicine

## 2022-06-16 DIAGNOSIS — I10 Essential (primary) hypertension: Secondary | ICD-10-CM

## 2022-06-20 ENCOUNTER — Encounter: Payer: Managed Care, Other (non HMO) | Admitting: Internal Medicine

## 2022-06-22 ENCOUNTER — Ambulatory Visit: Payer: Managed Care, Other (non HMO) | Admitting: Internal Medicine

## 2022-06-22 ENCOUNTER — Encounter: Payer: Self-pay | Admitting: Internal Medicine

## 2022-06-22 VITALS — BP 124/86 | HR 86 | Temp 98.2°F | Ht 69.0 in | Wt 196.0 lb

## 2022-06-22 DIAGNOSIS — I10 Essential (primary) hypertension: Secondary | ICD-10-CM | POA: Diagnosis not present

## 2022-06-22 DIAGNOSIS — E785 Hyperlipidemia, unspecified: Secondary | ICD-10-CM | POA: Diagnosis not present

## 2022-06-22 DIAGNOSIS — Z0001 Encounter for general adult medical examination with abnormal findings: Secondary | ICD-10-CM

## 2022-06-22 DIAGNOSIS — Z0000 Encounter for general adult medical examination without abnormal findings: Secondary | ICD-10-CM

## 2022-06-22 DIAGNOSIS — R7303 Prediabetes: Secondary | ICD-10-CM | POA: Diagnosis not present

## 2022-06-22 DIAGNOSIS — E781 Pure hyperglyceridemia: Secondary | ICD-10-CM | POA: Diagnosis not present

## 2022-06-22 LAB — BASIC METABOLIC PANEL
BUN: 20 mg/dL (ref 6–23)
CO2: 27 mEq/L (ref 19–32)
Calcium: 9.4 mg/dL (ref 8.4–10.5)
Chloride: 99 mEq/L (ref 96–112)
Creatinine, Ser: 0.92 mg/dL (ref 0.40–1.50)
GFR: 92.61 mL/min (ref >60.00)
Glucose, Bld: 100 mg/dL — ABNORMAL HIGH (ref 70–99)
Potassium: 4.6 mEq/L (ref 3.5–5.1)
Sodium: 133 mEq/L — ABNORMAL LOW (ref 135–145)

## 2022-06-22 LAB — URINALYSIS, ROUTINE W REFLEX MICROSCOPIC
Bilirubin Urine: NEGATIVE
Hgb urine dipstick: NEGATIVE
Ketones, ur: NEGATIVE
Leukocytes,Ua: NEGATIVE
Nitrite: NEGATIVE
RBC / HPF: NONE SEEN (ref 0–?)
Specific Gravity, Urine: <=1.005 — AB (ref 1.000–1.030)
Total Protein, Urine: NEGATIVE
Urine Glucose: NEGATIVE
Urobilinogen, UA: 0.2 (ref 0.0–1.0)
WBC, UA: NONE SEEN (ref 0–?)
pH: 6 (ref 5.0–8.0)

## 2022-06-22 LAB — HEPATIC FUNCTION PANEL
ALT: 24 U/L (ref 0–53)
AST: 19 U/L (ref 0–37)
Albumin: 4.6 g/dL (ref 3.5–5.2)
Alkaline Phosphatase: 56 U/L (ref 39–117)
Bilirubin, Direct: 0.1 mg/dL (ref 0.0–0.3)
Total Bilirubin: 0.7 mg/dL (ref 0.2–1.2)
Total Protein: 7.7 g/dL (ref 6.0–8.3)

## 2022-06-22 LAB — CBC WITH DIFFERENTIAL/PLATELET
Basophils Absolute: 0 10*3/uL (ref 0.0–0.1)
Basophils Relative: 0.5 % (ref 0.0–3.0)
Eosinophils Absolute: 0.1 10*3/uL (ref 0.0–0.7)
Eosinophils Relative: 2.3 % (ref 0.0–5.0)
HCT: 44.3 % (ref 39.0–52.0)
Hemoglobin: 15.1 g/dL (ref 13.0–17.0)
Lymphocytes Relative: 29.6 % (ref 12.0–46.0)
Lymphs Abs: 1.5 10*3/uL (ref 0.7–4.0)
MCHC: 34 g/dL (ref 30.0–36.0)
MCV: 94.5 fl (ref 78.0–100.0)
Monocytes Absolute: 0.5 10*3/uL (ref 0.1–1.0)
Monocytes Relative: 9.4 % (ref 3.0–12.0)
Neutro Abs: 3 10*3/uL (ref 1.4–7.7)
Neutrophils Relative %: 58.2 % (ref 43.0–77.0)
Platelets: 250 10*3/uL (ref 150.0–400.0)
RBC: 4.69 Mil/uL (ref 4.22–5.81)
RDW: 12.9 % (ref 11.5–15.5)
WBC: 5.1 10*3/uL (ref 4.0–10.5)

## 2022-06-22 LAB — LIPID PANEL
Cholesterol: 171 mg/dL (ref 0–200)
HDL: 48.7 mg/dL (ref >39.00)
LDL Cholesterol: 98 mg/dL (ref 0–99)
NonHDL: 122.31
Total CHOL/HDL Ratio: 4
Triglycerides: 122 mg/dL (ref 0.0–149.0)
VLDL: 24.4 mg/dL (ref 0.0–40.0)

## 2022-06-22 LAB — HEMOGLOBIN A1C: Hgb A1c MFr Bld: 5.8 % (ref 4.6–6.5)

## 2022-06-22 LAB — PSA: PSA: 2.92 ng/mL (ref 0.10–4.00)

## 2022-06-22 MED ORDER — ROSUVASTATIN CALCIUM 20 MG PO TABS
20.0000 mg | ORAL_TABLET | Freq: Every day | ORAL | 1 refills | Status: DC
Start: 2022-06-22 — End: 2022-10-29

## 2022-06-22 MED ORDER — IRBESARTAN 150 MG PO TABS: 150.0000 mg | ORAL_TABLET | Freq: Every day | ORAL | 1 refills | Status: DC

## 2022-06-22 NOTE — Progress Notes (Signed)
Subjective:  Patient ID: Joel Wade, male    DOB: 05-28-1965  Age: 57 y.o. MRN: [id number]  CC: Annual Exam, Hypertension, and Hyperlipidemia   HPI Joel Wade presents for a CPX and f/up ---  He has seen a dermatologist about a rash on his face and has been prescribed a topical steroid.  He is active and denies chest pain, shortness of breath, diaphoresis, or edema.  Outpatient Medications Prior to Visit  Medication Sig Dispense Refill   cetirizine (ZYRTEC) 5 MG tablet Take 5 mg by mouth daily.     EPINEPHrine 0.3 mg/0.3 mL IJ SOAJ injection Inject 0.3 mg into the muscle as needed for anaphylaxis. 2 each 3   sildenafil (REVATIO) 20 MG tablet Take 4 tablets (80 mg total) by mouth daily as needed. 60 tablet 3   irbesartan (AVAPRO) 150 MG tablet TAKE 1 TABLET BY MOUTH EVERY DAY 90 tablet 0   rosuvastatin (CRESTOR) 20 MG tablet TAKE 1 TABLET BY MOUTH EVERY DAY 90 tablet 0   No facility-administered medications prior to visit.    ROS Review of Systems  Constitutional: Negative.  Negative for appetite change, diaphoresis, fatigue and unexpected weight change.  HENT: Negative.    Eyes: Negative.   Respiratory:  Negative for cough, chest tightness, shortness of breath and wheezing.   Cardiovascular:  Negative for chest pain, palpitations and leg swelling.  Gastrointestinal:  Negative for abdominal pain, constipation, diarrhea, nausea and vomiting.  Endocrine: Negative.   Genitourinary: Negative.  Negative for difficulty urinating.  Musculoskeletal: Negative.  Negative for arthralgias, joint swelling and myalgias.  Skin:  Positive for color change and rash.  Neurological:  Negative for dizziness, weakness and numbness.  Hematological:  Negative for adenopathy. Does not bruise/bleed easily.  Psychiatric/Behavioral: Negative.  Negative for behavioral problems and self-injury.     Objective:  BP 124/86 (BP Location: Right Arm, Patient Position: Sitting, Cuff Size: Large)    Pulse 86   Temp 98.2 F (36.8 C) (Oral)   Ht 5\' 9"  (1.753 m)   Wt 196 lb (88.9 kg)   SpO2 95%   BMI 28.94 kg/m   BP Readings from Last 3 Encounters:  06/22/22 124/86  09/21/21 124/84  03/30/21 132/82    Wt Readings from Last 3 Encounters:  06/22/22 196 lb (88.9 kg)  09/21/21 197 lb (89.4 kg)  03/30/21 213 lb (96.6 kg)    Physical Exam Vitals reviewed.  Constitutional:      Appearance: Normal appearance.  HENT:     Nose: Nose normal.     Mouth/Throat:     Mouth: Mucous membranes are moist.  Eyes:     General: No scleral icterus.    Conjunctiva/sclera: Conjunctivae normal.  Cardiovascular:     Rate and Rhythm: Normal rate and regular rhythm.     Heart sounds: No murmur heard. Pulmonary:     Effort: Pulmonary effort is normal.     Breath sounds: No stridor. No wheezing, rhonchi or rales.  Abdominal:     General: Abdomen is flat.     Palpations: There is no mass.     Tenderness: There is no abdominal tenderness. There is no guarding.     Hernia: No hernia is present. There is no hernia in the left inguinal area or right inguinal area.  Genitourinary:    Pubic Area: No rash.      Penis: Normal and circumcised.      Testes: Normal.     Epididymis:  Right: Normal.     Left: Normal.     Prostate: Normal. Not enlarged, not tender and no nodules present.     Rectum: Normal. Guaiac result negative. No mass, tenderness, anal fissure, external hemorrhoid or internal hemorrhoid. Normal anal tone.  Musculoskeletal:        General: Normal range of motion.     Cervical back: Neck supple.     Right lower leg: No edema.     Left lower leg: No edema.  Lymphadenopathy:     Lower Body: No right inguinal adenopathy. No left inguinal adenopathy.  Skin:    Findings: Erythema and rash present. No lesion.  Neurological:     General: No focal deficit present.     Mental Status: Mental status is at baseline.  Psychiatric:        Mood and Affect: Mood normal.        Behavior:  Behavior normal.     Lab Results  Component Value Date   WBC 5.1 06/22/2022   HGB 15.1 06/22/2022   HCT 44.3 06/22/2022   PLT 250.0 06/22/2022   GLUCOSE 100 (H) 06/22/2022   CHOL 171 06/22/2022   TRIG 122.0 06/22/2022   HDL 48.70 06/22/2022   LDLDIRECT 218.0 12/24/2018   LDLCALC 98 06/22/2022   ALT 24 06/22/2022   AST 19 06/22/2022   NA 133 (L) 06/22/2022   K 4.6 06/22/2022   CL 99 06/22/2022   CREATININE 0.92 06/22/2022   BUN 20 06/22/2022   CO2 27 06/22/2022   TSH 2.54 03/30/2021   PSA 2.92 06/22/2022   HGBA1C 5.8 06/22/2022    CT CARDIAC SCORING (SELF PAY ONLY)  Addendum Date: 10/14/2021   ADDENDUM REPORT: 10/14/2021 12:47 CLINICAL DATA:  Cardiovascular Disease Risk stratification EXAM: Coronary Calcium Score TECHNIQUE: A gated, non-contrast computed tomography scan of the heart was performed using 3mm slice thickness. Axial images were analyzed on a dedicated workstation. Calcium scoring of the coronary arteries was performed using the Agatston method. FINDINGS: Coronary arteries: Normal origins. Coronary Calcium Score: Left main: 0 Left anterior descending artery: 9 Left circumflex artery: 2 Right coronary artery: 0 Total: 11 Percentile: 81 Pericardium: Normal. Aorta: Mildly dilated caliber of ascending aorta. No aortic atherosclerosis noted. Non-cardiac: See separate report from [facility]. IMPRESSION: Coronary calcium score of 11. This was 81st percentile for age-, race-, and sex-matched controls. Mildly dilated caliber of ascending aorta RECOMMENDATIONS: Coronary artery calcium (CAC) score is a strong predictor of incident coronary heart disease (CHD) and provides predictive information beyond traditional risk factors. CAC scoring is reasonable to use in the decision to withhold, postpone, or initiate statin therapy in intermediate-risk or selected borderline-risk asymptomatic adults (age 37-75 years and LDL-C >=70 to <190 mg/dL) who do not have diabetes or  established atherosclerotic cardiovascular disease (ASCVD).* In intermediate-risk (10-year ASCVD risk >=7.5% to <20%) adults or selected borderline-risk (10-year ASCVD risk >=5% to <7.5%) adults in whom a CAC score is measured for the purpose of making a treatment decision the following recommendations have been made: If CAC=0, it is reasonable to withhold statin therapy and reassess in 5 to 10 years, as long as higher risk conditions are absent (diabetes mellitus, family history of premature CHD in first degree relatives (males <55 years; females <65 years), cigarette smoking, or LDL >=190 mg/dL). If CAC is 1 to 99, it is reasonable to initiate statin therapy for patients >=68 years of age. If CAC is >=100 or >=75th percentile, it is reasonable to initiate statin therapy  at any age. Cardiology referral should be considered for patients with CAC scores >=400 or >=75th percentile. *2018 AHA/ACC/AACVPR/AAPA/ABC/ACPM/ADA/AGS/APhA/ASPC/NLA/PCNA Guideline on the Management of Blood Cholesterol: A Report of the American College of Cardiology/American Heart Association Task Force on Clinical Practice Guidelines. J Am Coll Cardiol. 2019;73(24):3168-3209. Jodelle Red, MD Electronically Signed   By: Jodelle Red M.D.   On: 10/14/2021 12:47   Result Date: 10/14/2021 EXAM: OVER-READ INTERPRETATION  CT CHEST The following report is a limited chest CT over-read performed by radiologist Dr. Lupita Raider of [facility] on 10/12/2021. The coronary calcium score interpretation by the cardiologist is attached. COMPARISON:  None Available. FINDINGS: Vascular: 4.3 cm ascending thoracic aortic aneurysm is noted. Mediastinum/Nodes: Visualized mediastinum is unremarkable. Lungs/Pleura: Visualized pulmonary parenchyma is unremarkable. Upper Abdomen: Visualized portion of upper abdomen is unremarkable. Musculoskeletal: Visualized skeleton is unremarkable. IMPRESSION: 4.3 cm ascending thoracic aortic  aneurysm. Recommend annual imaging followup by CTA or MRA. This recommendation follows 2010 ACCF/AHA/AATS/ACR/ASA/SCA/SCAI/SIR/STS/SVM Guidelines for the Diagnosis and Management of Patients with Thoracic Aortic Disease. Circulation. 2010; 121: U440-H474. Aortic aneurysm NOS (ICD10-I71.9). Electronically Signed: By: Lupita Raider M.D. On: 10/13/2021 08:44    Assessment & Plan:   Essential hypertension- His BP is well-controlled.  Will continue the current antihypertensives. -     CBC with Differential/Platelet; Future -     Basic metabolic panel; Future -     Urinalysis, Routine w reflex microscopic; Future -     Hepatic function panel; Future -     Irbesartan; Take 1 tablet (150 mg total) by mouth daily.  Dispense: 90 tablet; Refill: 1  Hyperlipidemia with target LDL less than 130 - LDL goal achieved. Doing well on the statin  -     Lipid panel; Future -     Hepatic function panel; Future -     Rosuvastatin Calcium; Take 1 tablet (20 mg total) by mouth daily.  Dispense: 90 tablet; Refill: 1  Prediabetes- His A1c is down to 5.8%. -     Basic metabolic panel; Future -     Hemoglobin A1c; Future  Pure hypertriglyceridemia- Triglycerides are normal. -     Lipid panel; Future -     Hepatic function panel; Future  Encounter for general adult medical examination with abnormal findings- Exam completed, labs reviewed, vaccines reviewed, cancer screenings are up-to-date, patient education was given. -     PSA; Future     Follow-up: Return in about 6 months (around 12/23/2022).  Sanda Linger, MD

## 2022-06-22 NOTE — Patient Instructions (Signed)
Health Maintenance, Male Adopting a healthy lifestyle and getting preventive care are important in promoting health and wellness. Ask your health care provider about: The right schedule for you to have regular tests and exams. Things you can do on your own to prevent diseases and keep yourself healthy. What should I know about diet, weight, and exercise? Eat a healthy diet  Eat a diet that includes plenty of vegetables, fruits, low-fat dairy products, and lean protein. Do not eat a lot of foods that are high in solid fats, added sugars, or sodium. Maintain a healthy weight Body mass index (BMI) is a measurement that can be used to identify possible weight problems. It estimates body fat based on height and weight. Your health care provider can help determine your BMI and help you achieve or maintain a healthy weight. Get regular exercise Get regular exercise. This is one of the most important things you can do for your health. Most adults should: Exercise for at least 150 minutes each week. The exercise should increase your heart rate and make you sweat (moderate-intensity exercise). Do strengthening exercises at least twice a week. This is in addition to the moderate-intensity exercise. Spend less time sitting. Even light physical activity can be beneficial. Watch cholesterol and blood lipids Have your blood tested for lipids and cholesterol at 57 years of age, then have this test every 5 years. You may need to have your cholesterol levels checked more often if: Your lipid or cholesterol levels are high. You are older than 57 years of age. You are at high risk for heart disease. What should I know about cancer screening? Many types of cancers can be detected early and may often be prevented. Depending on your health history and family history, you may need to have cancer screening at various ages. This may include screening for: Colorectal cancer. Prostate cancer. Skin cancer. Lung  cancer. What should I know about heart disease, diabetes, and high blood pressure? Blood pressure and heart disease High blood pressure causes heart disease and increases the risk of stroke. This is more likely to develop in people who have high blood pressure readings or are overweight. Talk with your health care provider about your target blood pressure readings. Have your blood pressure checked: Every 3-5 years if you are 18-39 years of age. Every year if you are 40 years old or older. If you are between the ages of 65 and 75 and are a current or former smoker, ask your health care provider if you should have a one-time screening for abdominal aortic aneurysm (AAA). Diabetes Have regular diabetes screenings. This checks your fasting blood sugar level. Have the screening done: Once every three years after age 45 if you are at a normal weight and have a low risk for diabetes. More often and at a younger age if you are overweight or have a high risk for diabetes. What should I know about preventing infection? Hepatitis B If you have a higher risk for hepatitis B, you should be screened for this virus. Talk with your health care provider to find out if you are at risk for hepatitis B infection. Hepatitis C Blood testing is recommended for: Everyone born from 1945 through 1965. Anyone with known risk factors for hepatitis C. Sexually transmitted infections (STIs) You should be screened each year for STIs, including gonorrhea and chlamydia, if: You are sexually active and are younger than 57 years of age. You are older than 57 years of age and your   health care provider tells you that you are at risk for this type of infection. Your sexual activity has changed since you were last screened, and you are at increased risk for chlamydia or gonorrhea. Ask your health care provider if you are at risk. Ask your health care provider about whether you are at high risk for HIV. Your health care provider  may recommend a prescription medicine to help prevent HIV infection. If you choose to take medicine to prevent HIV, you should first get tested for HIV. You should then be tested every 3 months for as long as you are taking the medicine. Follow these instructions at home: Alcohol use Do not drink alcohol if your health care provider tells you not to drink. If you drink alcohol: Limit how much you have to 0-2 drinks a day. Know how much alcohol is in your drink. In the [place] one drink equals one 12 oz bottle of beer (355 mL), one 5 oz glass of wine (148 mL), or one 1 oz glass of hard liquor (44 mL). Lifestyle Do not use any products that contain nicotine or tobacco. These products include cigarettes, chewing tobacco, and vaping devices, such as e-cigarettes. If you need help quitting, ask your health care provider. Do not use street drugs. Do not share needles. Ask your health care provider for help if you need support or information about quitting drugs. General instructions Schedule regular health, dental, and eye exams. Stay current with your vaccines. Tell your health care provider if: You often feel depressed. You have ever been abused or do not feel safe at home. Summary Adopting a healthy lifestyle and getting preventive care are important in promoting health and wellness. Follow your health care provider's instructions about healthy diet, exercising, and getting tested or screened for diseases. Follow your health care provider's instructions on monitoring your cholesterol and blood pressure. This information is not intended to replace advice given to you by your health care provider. Make sure you discuss any questions you have with your health care provider. Document Revised: 06/28/2020 Document Reviewed: 06/28/2020 Elsevier Patient Education  2023 [employer]  

## 2022-09-22 ENCOUNTER — Other Ambulatory Visit: Payer: Self-pay | Admitting: Internal Medicine

## 2022-09-22 DIAGNOSIS — I7121 Aneurysm of the ascending aorta, without rupture: Secondary | ICD-10-CM | POA: Insufficient documentation

## 2022-09-28 ENCOUNTER — Other Ambulatory Visit: Payer: Managed Care, Other (non HMO)

## 2022-09-29 ENCOUNTER — Other Ambulatory Visit: Payer: Managed Care, Other (non HMO)

## 2022-10-04 ENCOUNTER — Ambulatory Visit
Admission: RE | Admit: 2022-10-04 | Discharge: 2022-10-04 | Disposition: A | Discharge status: Home / Self Care | Payer: Managed Care, Other (non HMO) | Source: Ambulatory Visit | Attending: Internal Medicine | Admitting: Internal Medicine

## 2022-10-04 DIAGNOSIS — I7121 Aneurysm of the ascending aorta, without rupture: Secondary | ICD-10-CM

## 2022-10-04 MED ORDER — IOPAMIDOL (ISOVUE-370) INJECTION 76%
75.0000 mL | Freq: Once | INTRAVENOUS | Status: AC | PRN
  Administered 2022-10-04: 75 mL via INTRAVENOUS

## 2022-10-29 ENCOUNTER — Other Ambulatory Visit: Payer: Self-pay | Admitting: Internal Medicine

## 2022-10-29 DIAGNOSIS — E785 Hyperlipidemia, unspecified: Secondary | ICD-10-CM

## 2023-03-16 ENCOUNTER — Other Ambulatory Visit: Payer: Self-pay | Admitting: Internal Medicine

## 2023-03-16 DIAGNOSIS — I10 Essential (primary) hypertension: Secondary | ICD-10-CM

## 2023-07-29 ENCOUNTER — Other Ambulatory Visit: Payer: Self-pay | Admitting: Internal Medicine

## 2023-07-29 DIAGNOSIS — E785 Hyperlipidemia, unspecified: Secondary | ICD-10-CM

## 2023-08-20 ENCOUNTER — Ambulatory Visit (INDEPENDENT_AMBULATORY_CARE_PROVIDER_SITE_OTHER): Admitting: Internal Medicine

## 2023-08-20 ENCOUNTER — Encounter: Payer: Self-pay | Admitting: Internal Medicine

## 2023-08-20 VITALS — BP 144/92 | HR 70 | Temp 98.2°F | Resp 16 | Ht 69.0 in | Wt 209.4 lb

## 2023-08-20 DIAGNOSIS — Z0000 Encounter for general adult medical examination without abnormal findings: Secondary | ICD-10-CM | POA: Diagnosis not present

## 2023-08-20 DIAGNOSIS — I10 Essential (primary) hypertension: Secondary | ICD-10-CM

## 2023-08-20 DIAGNOSIS — R7303 Prediabetes: Secondary | ICD-10-CM

## 2023-08-20 DIAGNOSIS — E781 Pure hyperglyceridemia: Secondary | ICD-10-CM | POA: Diagnosis not present

## 2023-08-20 DIAGNOSIS — Z23 Encounter for immunization: Secondary | ICD-10-CM

## 2023-08-20 DIAGNOSIS — Z125 Encounter for screening for malignant neoplasm of prostate: Secondary | ICD-10-CM | POA: Diagnosis not present

## 2023-08-20 DIAGNOSIS — E785 Hyperlipidemia, unspecified: Secondary | ICD-10-CM

## 2023-08-20 DIAGNOSIS — Z0001 Encounter for general adult medical examination with abnormal findings: Secondary | ICD-10-CM

## 2023-08-20 LAB — CBC WITH DIFFERENTIAL/PLATELET
Basophils Absolute: 0 10*3/uL (ref 0.0–0.1)
Basophils Relative: 0.6 % (ref 0.0–3.0)
Eosinophils Absolute: 0.2 10*3/uL (ref 0.0–0.7)
Eosinophils Relative: 3 % (ref 0.0–5.0)
HCT: 41.2 % (ref 39.0–52.0)
Hemoglobin: 14 g/dL (ref 13.0–17.0)
Lymphocytes Relative: 29.7 % (ref 12.0–46.0)
Lymphs Abs: 1.7 10*3/uL (ref 0.7–4.0)
MCHC: 34.1 g/dL (ref 30.0–36.0)
MCV: 92.6 fl (ref 78.0–100.0)
Monocytes Absolute: 0.5 10*3/uL (ref 0.1–1.0)
Monocytes Relative: 9.5 % (ref 3.0–12.0)
Neutro Abs: 3.3 10*3/uL (ref 1.4–7.7)
Neutrophils Relative %: 57.2 % (ref 43.0–77.0)
Platelets: 229 10*3/uL (ref 150.0–400.0)
RBC: 4.45 Mil/uL (ref 4.22–5.81)
RDW: 13.1 % (ref 11.5–15.5)
WBC: 5.8 10*3/uL (ref 4.0–10.5)

## 2023-08-20 LAB — TSH: TSH: 1.55 u[IU]/mL (ref 0.35–5.50)

## 2023-08-20 LAB — URINALYSIS, ROUTINE W REFLEX MICROSCOPIC
Bilirubin Urine: NEGATIVE
Hgb urine dipstick: NEGATIVE
Ketones, ur: NEGATIVE
Leukocytes,Ua: NEGATIVE
Nitrite: NEGATIVE
Specific Gravity, Urine: 1.01 (ref 1.000–1.030)
Total Protein, Urine: NEGATIVE
Urine Glucose: NEGATIVE
Urobilinogen, UA: 0.2 (ref 0.0–1.0)
pH: 6 (ref 5.0–8.0)

## 2023-08-20 LAB — BASIC METABOLIC PANEL WITH GFR
BUN: 17 mg/dL (ref 6–23)
CO2: 26 meq/L (ref 19–32)
Calcium: 9.2 mg/dL (ref 8.4–10.5)
Chloride: 101 meq/L (ref 96–112)
Creatinine, Ser: 0.81 mg/dL (ref 0.40–1.50)
GFR: 97.36 mL/min (ref >60.00)
Glucose, Bld: 91 mg/dL (ref 70–99)
Potassium: 4.1 meq/L (ref 3.5–5.1)
Sodium: 136 meq/L (ref 135–145)

## 2023-08-20 LAB — LIPID PANEL
Cholesterol: 186 mg/dL (ref 0–200)
HDL: 44.9 mg/dL (ref >39.00)
LDL Cholesterol: 108 mg/dL — ABNORMAL HIGH (ref 0–99)
NonHDL: 140.99
Total CHOL/HDL Ratio: 4
Triglycerides: 163 mg/dL — ABNORMAL HIGH (ref 0.0–149.0)
VLDL: 32.6 mg/dL (ref 0.0–40.0)

## 2023-08-20 LAB — HEMOGLOBIN A1C: Hgb A1c MFr Bld: 6.2 % (ref 4.6–6.5)

## 2023-08-20 LAB — PSA: PSA: 2.08 ng/mL (ref 0.10–4.00)

## 2023-08-20 LAB — HEPATIC FUNCTION PANEL
ALT: 30 U/L (ref 0–53)
AST: 19 U/L (ref 0–37)
Albumin: 4.5 g/dL (ref 3.5–5.2)
Alkaline Phosphatase: 59 U/L (ref 39–117)
Bilirubin, Direct: 0.1 mg/dL (ref 0.0–0.3)
Total Bilirubin: 0.6 mg/dL (ref 0.2–1.2)
Total Protein: 7.2 g/dL (ref 6.0–8.3)

## 2023-08-20 NOTE — Patient Instructions (Signed)
 Health Maintenance, Male  Adopting a healthy lifestyle and getting preventive care are important in promoting health and wellness. Ask your health care provider about:  The right schedule for you to have regular tests and exams.  Things you can do on your own to prevent diseases and keep yourself healthy.  What should I know about diet, weight, and exercise?  Eat a healthy diet    Eat a diet that includes plenty of vegetables, fruits, low-fat dairy products, and lean protein.  Do not eat a lot of foods that are high in solid fats, added sugars, or sodium.  Maintain a healthy weight  Body mass index (BMI) is a measurement that can be used to identify possible weight problems. It estimates body fat based on height and weight. Your health care provider can help determine your BMI and help you achieve or maintain a healthy weight.  Get regular exercise  Get regular exercise. This is one of the most important things you can do for your health. Most adults should:  Exercise for at least 150 minutes each week. The exercise should increase your heart rate and make you sweat (moderate-intensity exercise).  Do strengthening exercises at least twice a week. This is in addition to the moderate-intensity exercise.  Spend less time sitting. Even light physical activity can be beneficial.  Watch cholesterol and blood lipids  Have your blood tested for lipids and cholesterol at 58 years of age, then have this test every 5 years.  You may need to have your cholesterol levels checked more often if:  Your lipid or cholesterol levels are high.  You are older than 58 years of age.  You are at high risk for heart disease.  What should I know about cancer screening?  Many types of cancers can be detected early and may often be prevented. Depending on your health history and family history, you may need to have cancer screening at various ages. This may include screening for:  Colorectal cancer.  Prostate cancer.  Skin cancer.  Lung  cancer.  What should I know about heart disease, diabetes, and high blood pressure?  Blood pressure and heart disease  High blood pressure causes heart disease and increases the risk of stroke. This is more likely to develop in people who have high blood pressure readings or are overweight.  Talk with your health care provider about your target blood pressure readings.  Have your blood pressure checked:  Every 3-5 years if you are 9-95 years of age.  Every year if you are 85 years old or older.  If you are between the ages of 29 and 29 and are a current or former smoker, ask your health care provider if you should have a one-time screening for abdominal aortic aneurysm (AAA).  Diabetes  Have regular diabetes screenings. This checks your fasting blood sugar level. Have the screening done:  Once every three years after age 23 if you are at a normal weight and have a low risk for diabetes.  More often and at a younger age if you are overweight or have a high risk for diabetes.  What should I know about preventing infection?  Hepatitis B  If you have a higher risk for hepatitis B, you should be screened for this virus. Talk with your health care provider to find out if you are at risk for hepatitis B infection.  Hepatitis C  Blood testing is recommended for:  Everyone born from [birth year] through 1965.  Anyone  with known risk factors for hepatitis C.  Sexually transmitted infections (STIs)  You should be screened each year for STIs, including gonorrhea and chlamydia, if:  You are sexually active and are younger than 58 years of age.  You are older than 58 years of age and your health care provider tells you that you are at risk for this type of infection.  Your sexual activity has changed since you were last screened, and you are at increased risk for chlamydia or gonorrhea. Ask your health care provider if you are at risk.  Ask your health care provider about whether you are at high risk for HIV. Your health care provider  may recommend a prescription medicine to help prevent HIV infection. If you choose to take medicine to prevent HIV, you should first get tested for HIV. You should then be tested every 3 months for as long as you are taking the medicine.  Follow these instructions at home:  Alcohol use  Do not drink alcohol if your health care provider tells you not to drink.  If you drink alcohol:  Limit how much you have to 0-2 drinks a day.  Know how much alcohol is in your drink. In the [place] one drink equals one 12 oz bottle of beer (355 mL), one 5 oz glass of wine (148 mL), or one 1 oz glass of hard liquor (44 mL).  Lifestyle  Do not use any products that contain nicotine or tobacco. These products include cigarettes, chewing tobacco, and vaping devices, such as e-cigarettes. If you need help quitting, ask your health care provider.  Do not use street drugs.  Do not share needles.  Ask your health care provider for help if you need support or information about quitting drugs.  General instructions  Schedule regular health, dental, and eye exams.  Stay current with your vaccines.  Tell your health care provider if:  You often feel depressed.  You have ever been abused or do not feel safe at home.  Summary  Adopting a healthy lifestyle and getting preventive care are important in promoting health and wellness.  Follow your health care provider's instructions about healthy diet, exercising, and getting tested or screened for diseases.  Follow your health care provider's instructions on monitoring your cholesterol and blood pressure.  This information is not intended to replace advice given to you by your health care provider. Make sure you discuss any questions you have with your health care provider.  Document Revised: 06/28/2020 Document Reviewed: 06/28/2020  Elsevier Patient Education  2024 [employer].

## 2023-08-20 NOTE — Progress Notes (Unsigned)
 Subjective:  Patient ID: Joel Wade, male    DOB: 12-Apr-1965  Age: 58 y.o. MRN: [id number]  CC: Annual Exam (Medication refills. No Concerns. ) and Hypertension   HPI Joel Wade presents for a CPX and f/up ---  Discussed the use of AI scribe software for clinical note transcription with the patient, who gave verbal consent to proceed.  History of Present Illness   Joel Wade is a 58 year old male with hypertension and aortic aneurysm who presents for a routine follow-up.  Joel Wade feels generally well but is experiencing some anxiety today. Joel Wade has no headache or blurred vision, although Joel Wade has vision problems due to cataracts, with surgery scheduled in August. Driving, especially at night, is challenging due to these vision issues.  Joel Wade has no pain or shortness of breath and no side effects from medications. The aortic aneurysm is asymptomatic with no chest or upper back pain.  His last colonoscopy was eight years ago and was normal. Joel Wade has no symptoms of colon or prostate cancer and no family history of these conditions in his immediate family.  Joel Wade recently started using metronidazole for skin issues under the care of a new dermatologist and is experiencing some breakouts possibly due to adjusting to the new medication.       Outpatient Medications Prior to Visit  Medication Sig Dispense Refill   cetirizine (ZYRTEC) 5 MG tablet Take 5 mg by mouth daily.     EPINEPHrine  0.3 mg/0.3 mL IJ SOAJ injection Inject 0.3 mg into the muscle as needed for anaphylaxis. 2 each 3   metroNIDAZOLE (METROCREAM) 0.75 % cream Apply 1 Application topically 2 (two) times daily.     sildenafil  (REVATIO ) 20 MG tablet Take 4 tablets (80 mg total) by mouth daily as needed. 60 tablet 3   irbesartan  (AVAPRO ) 150 MG tablet Take 1 tablet (150 mg total) by mouth daily. 90 tablet 1   rosuvastatin  (CRESTOR ) 20 MG tablet TAKE 1 TABLET BY MOUTH EVERY DAY 90 tablet 1   No facility-administered  medications prior to visit.    ROS Review of Systems  Constitutional:  Positive for unexpected weight change (wt gain). Negative for appetite change, chills, diaphoresis, fatigue and fever.  HENT: Negative.    Eyes: Negative.   Respiratory: Negative.  Negative for cough, chest tightness and wheezing.   Cardiovascular:  Negative for chest pain, palpitations and leg swelling.  Gastrointestinal: Negative.  Negative for abdominal pain, constipation, diarrhea, nausea and vomiting.  Endocrine: Negative.   Genitourinary: Negative.  Negative for difficulty urinating.  Musculoskeletal:  Negative for arthralgias and myalgias.  Skin: Negative.   Allergic/Immunologic: Negative.   Neurological: Negative.  Negative for dizziness, weakness and numbness.  Hematological:  Negative for adenopathy. Does not bruise/bleed easily.  Psychiatric/Behavioral: Negative.      Objective:  BP (!) 144/92 (BP Location: Left Arm, Patient Position: Sitting, Cuff Size: Normal)   Pulse 70   Temp 98.2 F (36.8 C) (Oral)   Resp 16   Ht 5' 9 (1.753 m)   Wt 209 lb 6.4 oz (95 kg)   SpO2 96%   BMI 30.92 kg/m   BP Readings from Last 3 Encounters:  08/20/23 (!) 144/92  06/22/22 124/86  09/21/21 124/84    Wt Readings from Last 3 Encounters:  08/20/23 209 lb 6.4 oz (95 kg)  06/22/22 196 lb (88.9 kg)  09/21/21 197 lb (89.4 kg)    Physical Exam Vitals reviewed.  Constitutional:  Appearance: Normal appearance.  HENT:     Nose: Nose normal.     Mouth/Throat:     Mouth: Mucous membranes are moist.   Eyes:     General: No scleral icterus.    Conjunctiva/sclera: Conjunctivae normal.    Cardiovascular:     Rate and Rhythm: Normal rate and regular rhythm.     Pulses: Normal pulses.     Heart sounds: No murmur heard.    No friction rub. No gallop.     Comments: EKG- NSR, 65 bpm LAD RBBB  LVH Unchanged  Pulmonary:     Effort: Pulmonary effort is normal.     Breath sounds: No stridor. No  wheezing, rhonchi or rales.  Abdominal:     General: Abdomen is flat.     Palpations: There is no mass.     Tenderness: There is no abdominal tenderness. There is no guarding.     Hernia: No hernia is present. There is no hernia in the left inguinal area or right inguinal area.  Genitourinary:    Pubic Area: No rash.      Penis: Normal and circumcised.      Testes: Normal.     Epididymis:     Right: Normal.     Left: Normal.     Prostate: Normal. Not enlarged, not tender and no nodules present.     Rectum: Normal. Guaiac result negative. No mass, tenderness, anal fissure, external hemorrhoid or internal hemorrhoid. Normal anal tone.   Musculoskeletal:        General: No swelling. Normal range of motion.     Cervical back: Neck supple.     Right lower leg: No edema.     Left lower leg: No edema.  Lymphadenopathy:     Cervical: No cervical adenopathy.     Lower Body: No right inguinal adenopathy. No left inguinal adenopathy.   Skin:    Findings: Erythema present. No lesion or rash.   Neurological:     General: No focal deficit present.     Mental Status: Joel Wade is alert. Mental status is at baseline.   Psychiatric:        Mood and Affect: Mood normal.        Behavior: Behavior normal.     Lab Results  Component Value Date   WBC 5.8 08/20/2023   HGB 14.0 08/20/2023   HCT 41.2 08/20/2023   PLT 229.0 08/20/2023   GLUCOSE 91 08/20/2023   CHOL 186 08/20/2023   TRIG 163.0 (H) 08/20/2023   HDL 44.90 08/20/2023   LDLDIRECT 218.0 12/24/2018   LDLCALC 108 (H) 08/20/2023   ALT 30 08/20/2023   AST 19 08/20/2023   NA 136 08/20/2023   K 4.1 08/20/2023   CL 101 08/20/2023   CREATININE 0.81 08/20/2023   BUN 17 08/20/2023   CO2 26 08/20/2023   TSH 1.55 08/20/2023   PSA 2.08 08/20/2023   HGBA1C 6.2 08/20/2023    CT ANGIO CHEST AORTA W/CM & OR WO/CM Result Date: 10/04/2022 CLINICAL DATA:  Thoracic aorta disease, pre-op planning EXAM: CT ANGIOGRAPHY CHEST WITH CONTRAST  TECHNIQUE: Multidetector CT imaging of the chest was performed using the standard protocol during bolus administration of intravenous contrast. Multiplanar CT image reconstructions and MIPs were obtained to evaluate the vascular anatomy. RADIATION DOSE REDUCTION: This exam was performed according to the departmental dose-optimization program which includes automated exposure control, adjustment of the mA and/or kV according to patient size and/or use of iterative reconstruction technique. CONTRAST:  75mL ISOVUE -370 IOPAMIDOL  (ISOVUE -370) INJECTION 76% COMPARISON:  None Available. FINDINGS: Cardiovascular: Preferential opacification of the thoracic aorta. Ascending thoracic aorta aneurysm with caliber measuring up to 4 cm. The descending thoracic aorta is normal in caliber. No evidence of thoracic aortic dissection. Normal heart size. No significant pericardial effusion. No atherosclerotic plaque of the thoracic aorta. No coronary artery calcifications. The main pulmonary artery is normal in caliber. Mediastinum/Nodes: No enlarged mediastinal, hilar, or axillary lymph nodes. Thyroid  gland, trachea, and esophagus demonstrate no significant findings. Lungs/Pleura: No focal consolidation. No pulmonary nodule. No pulmonary mass. No pleural effusion. No pneumothorax. Upper Abdomen: No acute abnormality. Musculoskeletal: No abdominal wall hernia or abnormality. No suspicious lytic or blastic osseous lesions. No acute displaced fracture. Mild degenerative changes of the spine. Review of the MIP images confirms the above findings. IMPRESSION: 1. Ascending thoracic aorta aneurysm (4 cm). Recommend annual imaging followup by CTA or MRA. This recommendation follows 2010 ACCF/AHA/AATS/ACR/ASA/SCA/SCAI/SIR/STS/SVM Guidelines for the Diagnosis and Management of Patients with Thoracic Aortic Disease. Circulation. 2010; 121: Z733-z630. Aortic aneurysm NOS (ICD10-I71.9). 2. No acute intrathoracic abnormality. Electronically Signed    By: Morgane  Naveau M.D.   On: 10/04/2022 20:33    Assessment & Plan:  Immunization due -     Heplisav-B (HepB-CPG) Vaccine  Essential hypertension- Joel Wade has not achieved his BP goal. Will restart the ARB. -     EKG 12-Lead -     TSH; Future -     Urinalysis, Routine w reflex microscopic; Future -     Basic metabolic panel with GFR; Future -     CBC with Differential/Platelet; Future -     Irbesartan ; Take 1 tablet (300 mg total) by mouth daily.  Dispense: 90 tablet; Refill: 1  Hyperlipidemia with target LDL less than 130- LDL goal achieved. Doing well on the statin  -     Lipid panel; Future -     TSH; Future -     Hepatic function panel; Future -     Rosuvastatin  Calcium ; Take 1 tablet (20 mg total) by mouth daily.  Dispense: 90 tablet; Refill: 1  Prediabetes -     Hemoglobin A1c; Future -     Basic metabolic panel with GFR; Future  Encounter for general adult medical examination with abnormal findings- Exam completed, labs reviewed, vaccines reviewed and updated, cancer screenings addressed, pt ed material was given.  -     PSA; Future  Pure hypertriglyceridemia -     Lipid panel; Future     Follow-up: Return in about 6 months (around 02/19/2024).  Debby Molt, MD

## 2023-08-21 ENCOUNTER — Ambulatory Visit: Payer: Self-pay | Admitting: Internal Medicine

## 2023-08-21 MED ORDER — ROSUVASTATIN CALCIUM 20 MG PO TABS: 20.0000 mg | ORAL_TABLET | Freq: Every day | ORAL | 1 refills | Status: DC

## 2023-08-21 MED ORDER — IRBESARTAN 300 MG PO TABS: 300.0000 mg | ORAL_TABLET | Freq: Every day | ORAL | 1 refills | Status: DC

## 2023-09-20 ENCOUNTER — Ambulatory Visit (INDEPENDENT_AMBULATORY_CARE_PROVIDER_SITE_OTHER)

## 2023-09-20 DIAGNOSIS — Z23 Encounter for immunization: Secondary | ICD-10-CM

## 2023-09-20 NOTE — Progress Notes (Signed)
 Pt responded well to Hep-B vaccine

## 2023-09-21 ENCOUNTER — Other Ambulatory Visit: Payer: Self-pay | Admitting: Internal Medicine

## 2023-09-21 DIAGNOSIS — I7121 Aneurysm of the ascending aorta, without rupture: Secondary | ICD-10-CM

## 2023-09-24 ENCOUNTER — Encounter: Payer: Self-pay | Admitting: Internal Medicine

## 2023-10-01 ENCOUNTER — Other Ambulatory Visit: Payer: Self-pay | Admitting: Internal Medicine

## 2023-10-01 MED ORDER — ACETAZOLAMIDE 250 MG PO TABS
250.0000 mg | ORAL_TABLET | Freq: Two times a day (BID) | ORAL | 0 refills | Status: DC
Start: 2023-10-01 — End: 2023-10-11

## 2023-10-03 ENCOUNTER — Ambulatory Visit
Admission: RE | Admit: 2023-10-03 | Discharge: 2023-10-03 | Disposition: A | Discharge status: Home / Self Care | Source: Ambulatory Visit | Attending: Internal Medicine | Admitting: Internal Medicine

## 2023-10-03 DIAGNOSIS — I7121 Aneurysm of the ascending aorta, without rupture: Secondary | ICD-10-CM

## 2023-10-03 MED ORDER — GADOPICLENOL 0.5 MMOL/ML IV SOLN
10.0000 mL | Freq: Once | INTRAVENOUS | Status: AC | PRN
  Administered 2023-10-03 (×2): 10 mL via INTRAVENOUS

## 2023-10-07 ENCOUNTER — Ambulatory Visit: Payer: Self-pay | Admitting: Internal Medicine

## 2023-10-11 ENCOUNTER — Other Ambulatory Visit: Payer: Self-pay | Admitting: Internal Medicine

## 2023-10-11 MED ORDER — ACETAZOLAMIDE 250 MG PO TABS: 250.0000 mg | ORAL_TABLET | Freq: Two times a day (BID) | ORAL | 0 refills | Status: DC

## 2023-10-14 ENCOUNTER — Other Ambulatory Visit: Payer: Self-pay | Admitting: Internal Medicine

## 2023-10-14 DIAGNOSIS — I10 Essential (primary) hypertension: Secondary | ICD-10-CM

## 2023-10-17 ENCOUNTER — Encounter: Payer: Self-pay | Admitting: Internal Medicine

## 2023-11-07 ENCOUNTER — Other Ambulatory Visit: Payer: Self-pay | Admitting: Internal Medicine

## 2023-11-07 DIAGNOSIS — I10 Essential (primary) hypertension: Secondary | ICD-10-CM

## 2023-12-18 ENCOUNTER — Encounter: Payer: Self-pay | Admitting: Internal Medicine

## 2023-12-18 ENCOUNTER — Ambulatory Visit (INDEPENDENT_AMBULATORY_CARE_PROVIDER_SITE_OTHER): Admitting: Internal Medicine

## 2023-12-18 VITALS — BP 136/86 | HR 85 | Temp 98.2°F | Resp 16 | Ht 69.0 in | Wt 212.4 lb

## 2023-12-18 DIAGNOSIS — N5201 Erectile dysfunction due to arterial insufficiency: Secondary | ICD-10-CM | POA: Diagnosis not present

## 2023-12-18 DIAGNOSIS — Z7252 High risk homosexual behavior: Secondary | ICD-10-CM | POA: Diagnosis not present

## 2023-12-18 DIAGNOSIS — R7303 Prediabetes: Secondary | ICD-10-CM | POA: Diagnosis not present

## 2023-12-18 DIAGNOSIS — I10 Essential (primary) hypertension: Secondary | ICD-10-CM

## 2023-12-18 LAB — BASIC METABOLIC PANEL WITH GFR
BUN: 16 mg/dL (ref 6–23)
CO2: 27 meq/L (ref 19–32)
Calcium: 8.9 mg/dL (ref 8.4–10.5)
Chloride: 101 meq/L (ref 96–112)
Creatinine, Ser: 0.77 mg/dL (ref 0.40–1.50)
GFR: 98.63 mL/min (ref >60.00)
Glucose, Bld: 105 mg/dL — ABNORMAL HIGH (ref 70–99)
Potassium: 3.6 meq/L (ref 3.5–5.1)
Sodium: 136 meq/L (ref 135–145)

## 2023-12-18 LAB — HEMOGLOBIN A1C: Hgb A1c MFr Bld: 6.2 % (ref 4.6–6.5)

## 2023-12-18 MED ORDER — DOXYCYCLINE MONOHYDRATE 100 MG PO CAPS: 200.0000 mg | ORAL_CAPSULE | Freq: Once | ORAL | 2 refills | Status: AC

## 2023-12-18 MED ORDER — SILDENAFIL CITRATE 20 MG PO TABS: 80.0000 mg | ORAL_TABLET | Freq: Every day | ORAL | 3 refills | Status: AC | PRN

## 2023-12-18 NOTE — Progress Notes (Unsigned)
 Subjective:  Patient ID: Joel Wade, male    DOB: [birth year]  Age: 58 y.o. MRN: [id number]  CC: Hypertension   HPI DIAMANTE RUBIN presents for f/up ----  He wants to start Doxy and HIV PrEP. He and his spouse may add new sexual partners in the future but have not done so yet. He feels well and offers no complaints.    Outpatient Medications Prior to Visit  Medication Sig Dispense Refill   ASPIRIN  81 PO Take by mouth.     cetirizine (ZYRTEC) 5 MG tablet Take 5 mg by mouth daily.     EPINEPHrine  0.3 mg/0.3 mL IJ SOAJ injection Inject 0.3 mg into the muscle as needed for anaphylaxis. 2 each 3   irbesartan  (AVAPRO ) 300 MG tablet Take 1 tablet (300 mg total) by mouth daily. 90 tablet 1   metroNIDAZOLE (METROCREAM) 0.75 % cream Apply 1 Application topically 2 (two) times daily.     rosuvastatin  (CRESTOR ) 20 MG tablet Take 1 tablet (20 mg total) by mouth daily. 90 tablet 1   sildenafil  (REVATIO ) 20 MG tablet Take 4 tablets (80 mg total) by mouth daily as needed. 60 tablet 3   acetaZOLAMIDE  (DIAMOX ) 250 MG tablet Take 1 tablet (250 mg total) by mouth 2 (two) times daily for 5 days. Start 48 hours before ascending and continue for 72 hours 10 tablet 0   No facility-administered medications prior to visit.    ROS Review of Systems  Constitutional:  Negative for appetite change, chills, diaphoresis, fatigue and fever.  HENT: Negative.  Negative for sore throat, trouble swallowing and voice change.   Eyes: Negative.   Respiratory: Negative.  Negative for cough, chest tightness, shortness of breath and wheezing.   Cardiovascular:  Negative for chest pain, palpitations and leg swelling.  Gastrointestinal: Negative.  Negative for abdominal pain, constipation, diarrhea, nausea and vomiting.  Endocrine: Negative.   Genitourinary: Negative.  Negative for difficulty urinating, dysuria, genital sores, hematuria, scrotal swelling, testicular pain and urgency.  Musculoskeletal: Negative.   Negative for arthralgias and myalgias.  Skin: Negative.  Negative for pallor and rash.  Neurological: Negative.  Negative for dizziness, weakness and light-headedness.  Hematological:  Negative for adenopathy. Does not bruise/bleed easily.  Psychiatric/Behavioral: Negative.      Objective:  BP 136/86 (BP Location: Left Arm, Patient Position: Sitting, Cuff Size: Normal)   Pulse 85   Temp 98.2 F (36.8 C) (Oral)   Resp 16   Ht 5' 9 (1.753 m)   Wt 212 lb 6.4 oz (96.3 kg)   SpO2 97%   BMI 31.37 kg/m   BP Readings from Last 3 Encounters:  12/18/23 136/86  08/20/23 (!) 144/92  06/22/22 124/86    Wt Readings from Last 3 Encounters:  12/18/23 212 lb 6.4 oz (96.3 kg)  08/20/23 209 lb 6.4 oz (95 kg)  06/22/22 196 lb (88.9 kg)    Physical Exam Vitals reviewed.  Constitutional:      Appearance: Normal appearance.  HENT:     Nose: Nose normal.     Mouth/Throat:     Mouth: Mucous membranes are moist.  Eyes:     General: No scleral icterus.    Conjunctiva/sclera: Conjunctivae normal.  Cardiovascular:     Rate and Rhythm: Normal rate and regular rhythm.     Heart sounds: No murmur heard.    No friction rub. No gallop.  Pulmonary:     Effort: Pulmonary effort is normal.     Breath sounds: No  stridor. No wheezing, rhonchi or rales.  Abdominal:     General: Abdomen is flat.     Palpations: There is no mass.     Tenderness: There is no abdominal tenderness. There is no guarding.     Hernia: No hernia is present.  Musculoskeletal:        General: Normal range of motion.     Cervical back: Neck supple.     Right lower leg: No edema.     Left lower leg: No edema.  Lymphadenopathy:     Cervical: No cervical adenopathy.  Skin:    General: Skin is warm and dry.     Coloration: Skin is not jaundiced or pale.     Findings: No rash.  Neurological:     General: No focal deficit present.     Mental Status: He is alert.  Psychiatric:        Mood and Affect: Mood normal.         Behavior: Behavior normal.     Lab Results  Component Value Date   WBC 5.8 08/20/2023   HGB 14.0 08/20/2023   HCT 41.2 08/20/2023   PLT 229.0 08/20/2023   GLUCOSE 105 (H) 12/18/2023   CHOL 186 08/20/2023   TRIG 163.0 (H) 08/20/2023   HDL 44.90 08/20/2023   LDLDIRECT 218.0 12/24/2018   LDLCALC 108 (H) 08/20/2023   ALT 30 08/20/2023   AST 19 08/20/2023   NA 136 12/18/2023   K 3.6 12/18/2023   CL 101 12/18/2023   CREATININE 0.77 12/18/2023   BUN 16 12/18/2023   CO2 27 12/18/2023   TSH 1.55 08/20/2023   PSA 2.08 08/20/2023   HGBA1C 6.2 12/18/2023    MR ANGIO CHEST W WO CONTRAST Addendum Date: 10/08/2023 ADDENDUM REPORT: 10/08/2023 08:24 CONTRAST:  10 mL Vueway  Electronically Signed   By: Wilkie Lent M.D.   On: 10/08/2023 08:24   Result Date: 10/08/2023 CLINICAL DATA:  Thoracic aortic aneurysm, follow-up EXAM: MRA CHEST WITH OR WITHOUT CONTRAST TECHNIQUE: Angiographic images of the chest were obtained using MRA technique without and with intravenous contrast. CONTRAST:  Not provided by the technologist. We will have to be included as an addendum. COMPARISON:  Prior CT scan of the chest 10/04/2022 FINDINGS: VASCULAR Aorta: Conventional 3 vessel arch anatomy. The aortic root is normal in caliber at 3.7 cm measured at the sinuses of Valsalva. No effacement of the sino-tubular junction. Minimal aneurysmal dilation of the ascending thoracic aorta with a maximal diameter of 4.0 cm, unchanged. No evidence of dissection. Heart: Normal in size.  No pericardial effusion. Pulmonary Arteries:  Normal in size.  No central PE. Other: None. NON-VASCULAR No focal signal abnormality or abnormal enhancement within the lungs, pleura, mediastinum, visualized upper abdomen or musculoskeletal soft tissues. IMPRESSION: Continued stability of mild aneurysmal dilation of the ascending thoracic aorta with a maximal diameter of 4.0 cm. Recommend annual imaging followup by CTA or MRA. This recommendation  follows 2010 ACCF/AHA/AATS/ACR/ASA/SCA/SCAI/SIR/STS/SVM Guidelines for the Diagnosis and Management of Patients with Thoracic Aortic Disease. Circulation. 2010; 121: Z733-z630. Aortic aneurysm NOS (ICD10-I71.9) Electronically Signed: By: Wilkie Lent M.D. On: 10/07/2023 14:56    Assessment & Plan:  High risk homosexual behavior -     Hepatitis B surface antigen; Future -     RPR; Future -     HIV Antibody (routine testing w rflx); Future -     Doxycycline Monohydrate; Take 2 capsules (200 mg total) by mouth once for 1 dose. Take 2 capsules  within 24 hours of unprotected sex  Dispense: 20 capsule; Refill: 2  Erectile dysfunction due to arterial insufficiency -     Sildenafil  Citrate; Take 4 tablets (80 mg total) by mouth daily as needed.  Dispense: 60 tablet; Refill: 3  Prediabetes -     Basic metabolic panel with GFR; Future -     Hemoglobin A1c; Future  Essential hypertension -     Basic metabolic panel with GFR; Future     Follow-up: Return in about 3 months (around 03/19/2024).  Debby Molt, MD

## 2023-12-18 NOTE — Patient Instructions (Signed)
 How to Have Safe Sex Having safe sex means taking steps before and during sex to reduce your risk of: Getting a sexually transmitted infection (STI). Giving your partner an STI. Unwanted or unplanned pregnancy. How to have safe sex Ways you can have safe sex  Limit your sex partners to only one partner who is only having sex with you. Avoid using alcohol and drugs before having sex. Alcohol and drugs can affect your judgment. Before having sex with a new partner: Talk to your partner about past partners, past STIs, and drug use. Get screened for STIs and discuss the results with your partner. Ask your partner to get screened too. Check your body regularly for sores, blisters, rashes, or unusual discharge. If you notice any of these things, call your health care provider. Avoid sexual contact if you have symptoms of an infection or you're being treated for an STI. While having sex, use a condom. Make sure to: Use a condom every time you have vaginal, oral, or anal sex. Both females and males should wear condoms during oral sex. Do not use a male condom and a male condom at the same time during vaginal sex. Using both types at the same time can cause condoms to break. Keep condoms in place from the beginning to the end of sexual activity. Use a latex condom, if possible. Latex condoms offer the best protection. Use only water-based lubricants with a condom. Using petroleum-based lubricants or oils will weaken the condom and increase the chance that it will break. Ways your health care provider can help you have safe sex  See your provider for regular screenings, exams, and tests for STIs. Talk with your provider about what kind of birth control is best for you. Get vaccinated against hepatitis B and human papillomavirus (HPV). If you're at risk of getting human immunodeficiency virus (HIV), talk with your provider about taking a medicine to prevent HIV. You're at risk for HIV if you: Are  a male who has sex with other males. Are sexually active with more than one partner. Take drugs by injection. Have a sex partner who has HIV. Have unprotected sex. Have sex with someone who has sex with both males and females. Follow these instructions at home: Take your medicines only as told. Call your provider if you think you might be pregnant. Call your provider if have any symptoms of an infection. Where to find more information Centers for Disease Control and Prevention (CDC): [employer].no Office on Women's Health: [employer] This information is not intended to replace advice given to you by your health care provider. Make sure you discuss any questions you have with your health care provider. Document Revised: 06/28/2022 Document Reviewed: 06/28/2022 Elsevier Patient Education  2024 [employer].

## 2023-12-19 LAB — RPR: RPR Ser Ql: NONREACTIVE

## 2023-12-19 LAB — HIV ANTIBODY (ROUTINE TESTING W REFLEX)
HIV 1&2 Ab, 4th Generation: NONREACTIVE
HIV FINAL INTERPRETATION: NEGATIVE

## 2023-12-19 LAB — HEPATITIS B SURFACE ANTIGEN: Hepatitis B Surface Ag: NONREACTIVE

## 2023-12-20 ENCOUNTER — Ambulatory Visit: Payer: Self-pay | Admitting: Internal Medicine

## 2023-12-20 MED ORDER — DESCOVY 200-25 MG PO TABS: 1.0000 | ORAL_TABLET | Freq: Every day | ORAL | 0 refills | Status: AC

## 2023-12-24 ENCOUNTER — Other Ambulatory Visit (HOSPITAL_COMMUNITY): Payer: Self-pay

## 2023-12-24 ENCOUNTER — Telehealth: Payer: Self-pay

## 2023-12-24 NOTE — Telephone Encounter (Signed)
 Pharmacy Patient Advocate Encounter   Received notification from CoverMyMeds that prior authorization for Sildenafil  20mg  tabs is required/requested.   Insurance verification completed.   The patient is insured through [employer].   Per test claim: PA required; PA submitted to above mentioned insurance via Latent Key/confirmation #/EOC [id number] Status is pending

## 2023-12-31 NOTE — Telephone Encounter (Signed)
 Patient has been made aware. Gave a verbal understanding and will use [facility] to get this script.

## 2023-12-31 NOTE — Telephone Encounter (Signed)
 Pharmacy Patient Advocate Encounter  Received notification from [employer] that Prior Authorization for Sildenafil  20mg  tabs has been DENIED.  See denial reason below. No denial letter attached in CMM. Will attach denial letter to Media tab once received.   PA #/Case ID/Reference #: [id number]       Looked up on [facility] to see how much it would be and it said $29.96 for #60 if filled at [facility].

## 2024-02-08 ENCOUNTER — Other Ambulatory Visit: Payer: Self-pay | Admitting: Internal Medicine

## 2024-02-08 DIAGNOSIS — I10 Essential (primary) hypertension: Secondary | ICD-10-CM

## 2024-02-13 ENCOUNTER — Other Ambulatory Visit: Payer: Self-pay | Admitting: Internal Medicine

## 2024-02-13 DIAGNOSIS — E785 Hyperlipidemia, unspecified: Secondary | ICD-10-CM

## 2024-02-19 ENCOUNTER — Encounter: Payer: Self-pay | Admitting: Internal Medicine

## 2024-02-19 ENCOUNTER — Ambulatory Visit (INDEPENDENT_AMBULATORY_CARE_PROVIDER_SITE_OTHER): Admitting: Internal Medicine

## 2024-02-19 VITALS — BP 136/94 | HR 80 | Temp 98.5°F | Resp 16 | Ht 69.0 in | Wt 213.6 lb

## 2024-02-19 DIAGNOSIS — I10 Essential (primary) hypertension: Secondary | ICD-10-CM | POA: Diagnosis not present

## 2024-02-19 NOTE — Patient Instructions (Signed)
 Hypertension, Adult High blood pressure (hypertension) is when the force of blood pumping through the arteries is too strong. The arteries are the blood vessels that carry blood from the heart throughout the body. Hypertension forces the heart to work harder to pump blood and may cause arteries to become narrow or stiff. Untreated or uncontrolled hypertension can lead to a heart attack, heart failure, a stroke, kidney disease, and other problems. A blood pressure reading consists of a higher number over a lower number. Ideally, your blood pressure should be below 120/80. The first ("top") number is called the systolic pressure. It is a measure of the pressure in your arteries as your heart beats. The second ("bottom") number is called the diastolic pressure. It is a measure of the pressure in your arteries as the heart relaxes. What are the causes? The exact cause of this condition is not known. There are some conditions that result in high blood pressure. What increases the risk? Certain factors may make you more likely to develop high blood pressure. Some of these risk factors are under your control, including: Smoking. Not getting enough exercise or physical activity. Being overweight. Having too much fat, sugar, calories, or salt (sodium) in your diet. Drinking too much alcohol. Other risk factors include: Having a personal history of heart disease, diabetes, high cholesterol, or kidney disease. Stress. Having a family history of high blood pressure and high cholesterol. Having obstructive sleep apnea. Age. The risk increases with age. What are the signs or symptoms? High blood pressure may not cause symptoms. Very high blood pressure (hypertensive crisis) may cause: Headache. Fast or irregular heartbeats (palpitations). Shortness of breath. Nosebleed. Nausea and vomiting. Vision changes. Severe chest pain, dizziness, and seizures. How is this diagnosed? This condition is diagnosed by  measuring your blood pressure while you are seated, with your arm resting on a flat surface, your legs uncrossed, and your feet flat on the floor. The cuff of the blood pressure monitor will be placed directly against the skin of your upper arm at the level of your heart. Blood pressure should be measured at least twice using the same arm. Certain conditions can cause a difference in blood pressure between your right and left arms. If you have a high blood pressure reading during one visit or you have normal blood pressure with other risk factors, you may be asked to: Return on a different day to have your blood pressure checked again. Monitor your blood pressure at home for 1 week or longer. If you are diagnosed with hypertension, you may have other blood or imaging tests to help your health care provider understand your overall risk for other conditions. How is this treated? This condition is treated by making healthy lifestyle changes, such as eating healthy foods, exercising more, and reducing your alcohol intake. You may be referred for counseling on a healthy diet and physical activity. Your health care provider may prescribe medicine if lifestyle changes are not enough to get your blood pressure under control and if: Your systolic blood pressure is above 130. Your diastolic blood pressure is above 80. Your personal target blood pressure may vary depending on your medical conditions, your age, and other factors. Follow these instructions at home: Eating and drinking  Eat a diet that is high in fiber and potassium, and low in sodium, added sugar, and fat. An example of this eating plan is called the DASH diet. DASH stands for Dietary Approaches to Stop Hypertension. To eat this way: Eat  plenty of fresh fruits and vegetables. Try to fill one half of your plate at each meal with fruits and vegetables. Eat whole grains, such as whole-wheat pasta, brown rice, or whole-grain bread. Fill about one  fourth of your plate with whole grains. Eat or drink low-fat dairy products, such as skim milk or low-fat yogurt. Avoid fatty cuts of meat, processed or cured meats, and poultry with skin. Fill about one fourth of your plate with lean proteins, such as fish, chicken without skin, beans, eggs, or tofu. Avoid pre-made and processed foods. These tend to be higher in sodium, added sugar, and fat. Reduce your daily sodium intake. Many people with hypertension should eat less than 1,500 mg of sodium a day. Do not drink alcohol if: Your health care provider tells you not to drink. You are pregnant, may be pregnant, or are planning to become pregnant. If you drink alcohol: Limit how much you have to: 0-1 drink a day for women. 0-2 drinks a day for men. Know how much alcohol is in your drink. In the [place] one drink equals one 12 oz bottle of beer (355 mL), one 5 oz glass of wine (148 mL), or one 1 oz glass of hard liquor (44 mL). Lifestyle  Work with your health care provider to maintain a healthy body weight or to lose weight. Ask what an ideal weight is for you. Get at least 30 minutes of exercise that causes your heart to beat faster (aerobic exercise) most days of the week. Activities may include walking, swimming, or biking. Include exercise to strengthen your muscles (resistance exercise), such as Pilates or lifting weights, as part of your weekly exercise routine. Try to do these types of exercises for 30 minutes at least 3 days a week. Do not use any products that contain nicotine or tobacco. These products include cigarettes, chewing tobacco, and vaping devices, such as e-cigarettes. If you need help quitting, ask your health care provider. Monitor your blood pressure at home as told by your health care provider. Keep all follow-up visits. This is important. Medicines Take over-the-counter and prescription medicines only as told by your health care provider. Follow directions carefully. Blood  pressure medicines must be taken as prescribed. Do not skip doses of blood pressure medicine. Doing this puts you at risk for problems and can make the medicine less effective. Ask your health care provider about side effects or reactions to medicines that you should watch for. Contact a health care provider if you: Think you are having a reaction to a medicine you are taking. Have headaches that keep coming back (recurring). Feel dizzy. Have swelling in your ankles. Have trouble with your vision. Get help right away if you: Develop a severe headache or confusion. Have unusual weakness or numbness. Feel faint. Have severe pain in your chest or abdomen. Vomit repeatedly. Have trouble breathing. These symptoms may be an emergency. Get help right away. Call 911. Do not wait to see if the symptoms will go away. Do not drive yourself to the hospital. Summary Hypertension is when the force of blood pumping through your arteries is too strong. If this condition is not controlled, it may put you at risk for serious complications. Your personal target blood pressure may vary depending on your medical conditions, your age, and other factors. For most people, a normal blood pressure is less than 120/80. Hypertension is treated with lifestyle changes, medicines, or a combination of both. Lifestyle changes include losing weight, eating a healthy,  low-sodium diet, exercising more, and limiting alcohol. This information is not intended to replace advice given to you by your health care provider. Make sure you discuss any questions you have with your health care provider. Document Revised: 12/14/2020 Document Reviewed: 12/14/2020 Elsevier Patient Education  2024 [employer].

## 2024-02-19 NOTE — Progress Notes (Signed)
 "  Subjective:  Patient ID: Joel Wade, male    DOB: 1965/07/20  Age: 58 y.o. MRN: [id number]  CC: Hypertension   HPI Joel Wade presents for f/up ---  Discussed the use of AI scribe software for clinical note transcription with the patient, who gave verbal consent to proceed.  History of Present Illness Joel Wade is a 58 year old male with hypertension who presents with concerns about blood pressure management.  He experiences facial flushing, which he attributes to elevated blood pressure. No headache or blurred vision. He notes poor sleep last night.  He experienced lightheadedness when his medication dose was initially increased but has since adjusted and no longer experiences this symptom. No current side effects from his medications. No chest pain, shortness of breath, or swelling in his legs or feet.  He has not been engaging in regular physical activity recently, citing a busy holiday season with family visiting as a reason for decreased activity. He lives with his partner and a dog.  He does not take any medications that would raise his blood pressure, such as anti-inflammatories or decongestants. He uses a nasal rinse regularly but does not believe it affects his blood pressure. He has not taken any sleep aids recently, although he has used melatonin or Benadryl in the past.     Outpatient Medications Prior to Visit  Medication Sig Dispense Refill   ASPIRIN  81 PO Take by mouth.     cetirizine (ZYRTEC) 5 MG tablet Take 5 mg by mouth daily.     emtricitabine-tenofovir AF (DESCOVY ) 200-25 MG tablet Take 1 tablet by mouth daily. 90 tablet 0   EPINEPHrine  0.3 mg/0.3 mL IJ SOAJ injection Inject 0.3 mg into the muscle as needed for anaphylaxis. 2 each 3   irbesartan  (AVAPRO ) 300 MG tablet TAKE 1 TABLET BY MOUTH EVERY DAY 90 tablet 1   metroNIDAZOLE (METROCREAM) 0.75 % cream Apply 1 Application topically 2 (two) times daily.     rosuvastatin  (CRESTOR ) 20 MG  tablet TAKE 1 TABLET BY MOUTH EVERY DAY 90 tablet 1   sildenafil  (REVATIO ) 20 MG tablet Take 4 tablets (80 mg total) by mouth daily as needed. 60 tablet 3   No facility-administered medications prior to visit.    ROS Review of Systems  Constitutional:  Positive for unexpected weight change (wt gain). Negative for appetite change, chills, diaphoresis and fatigue.  HENT: Negative.    Eyes: Negative.   Respiratory:  Negative for cough, chest tightness, shortness of breath and wheezing.   Cardiovascular:  Negative for chest pain, palpitations and leg swelling.  Gastrointestinal:  Negative for abdominal pain, constipation, diarrhea, nausea and vomiting.  Endocrine: Negative.   Genitourinary:  Negative for difficulty urinating.  Musculoskeletal: Negative.  Negative for arthralgias and myalgias.  Skin: Negative.   Neurological:  Negative for dizziness, weakness and light-headedness.  Hematological:  Negative for adenopathy. Does not bruise/bleed easily.  Psychiatric/Behavioral: Negative.      Objective:  BP (!) 136/94 (BP Location: Left Arm, Patient Position: Sitting, Cuff Size: Normal)   Pulse 80   Temp 98.5 F (36.9 C) (Oral)   Resp 16   Ht 5' 9 (1.753 m)   Wt 213 lb 9.6 oz (96.9 kg)   SpO2 96%   BMI 31.54 kg/m   BP Readings from Last 3 Encounters:  02/19/24 (!) 136/94  12/18/23 136/86  08/20/23 (!) 144/92    Wt Readings from Last 3 Encounters:  02/19/24 213 lb 9.6 oz (96.9 kg)  12/18/23 212 lb 6.4 oz (96.3 kg)  08/20/23 209 lb 6.4 oz (95 kg)    Physical Exam Vitals reviewed.  Constitutional:      Appearance: Normal appearance.  HENT:     Nose: Nose normal.     Mouth/Throat:     Mouth: Mucous membranes are moist.  Eyes:     General: No scleral icterus.    Conjunctiva/sclera: Conjunctivae normal.  Cardiovascular:     Rate and Rhythm: Normal rate and regular rhythm.     Heart sounds: No murmur heard.    No friction rub. No gallop.  Pulmonary:     Effort:  Pulmonary effort is normal.     Breath sounds: No stridor. No wheezing, rhonchi or rales.  Abdominal:     General: Abdomen is flat.     Palpations: There is no mass.     Tenderness: There is no abdominal tenderness. There is no guarding.     Hernia: No hernia is present.  Musculoskeletal:        General: Normal range of motion.     Cervical back: Neck supple.     Right lower leg: No edema.     Left lower leg: No edema.  Lymphadenopathy:     Cervical: No cervical adenopathy.  Skin:    General: Skin is warm and dry.  Neurological:     General: No focal deficit present.     Mental Status: He is alert.  Psychiatric:        Mood and Affect: Mood normal.        Behavior: Behavior normal.     Lab Results  Component Value Date   WBC 5.8 08/20/2023   HGB 14.0 08/20/2023   HCT 41.2 08/20/2023   PLT 229.0 08/20/2023   GLUCOSE 105 (H) 12/18/2023   CHOL 186 08/20/2023   TRIG 163.0 (H) 08/20/2023   HDL 44.90 08/20/2023   LDLDIRECT 218.0 12/24/2018   LDLCALC 108 (H) 08/20/2023   ALT 30 08/20/2023   AST 19 08/20/2023   NA 136 12/18/2023   K 3.6 12/18/2023   CL 101 12/18/2023   CREATININE 0.77 12/18/2023   BUN 16 12/18/2023   CO2 27 12/18/2023   TSH 1.55 08/20/2023   PSA 2.08 08/20/2023   HGBA1C 6.2 12/18/2023    MR ANGIO CHEST W WO CONTRAST Addendum Date: 10/08/2023 ADDENDUM REPORT: 10/08/2023 08:24 CONTRAST:  10 mL [employer]  Electronically Signed   By: Wilkie Lent M.D.   On: 10/08/2023 08:24   Result Date: 10/08/2023 CLINICAL DATA:  Thoracic aortic aneurysm, follow-up EXAM: MRA CHEST WITH OR WITHOUT CONTRAST TECHNIQUE: Angiographic images of the chest were obtained using MRA technique without and with intravenous contrast. CONTRAST:  Not provided by the technologist. We will have to be included as an addendum. COMPARISON:  Prior CT scan of the chest 10/04/2022 FINDINGS: VASCULAR Aorta: Conventional 3 vessel arch anatomy. The aortic root is normal in caliber at 3.7 cm measured  at the sinuses of Valsalva. No effacement of the sino-tubular junction. Minimal aneurysmal dilation of the ascending thoracic aorta with a maximal diameter of 4.0 cm, unchanged. No evidence of dissection. Heart: Normal in size.  No pericardial effusion. Pulmonary Arteries:  Normal in size.  No central PE. Other: None. NON-VASCULAR No focal signal abnormality or abnormal enhancement within the lungs, pleura, mediastinum, visualized upper abdomen or musculoskeletal soft tissues. IMPRESSION: Continued stability of mild aneurysmal dilation of the ascending thoracic aorta with a maximal diameter of 4.0 cm. Recommend annual imaging  followup by CTA or MRA. This recommendation follows 2010 ACCF/AHA/AATS/ACR/ASA/SCA/SCAI/SIR/STS/SVM Guidelines for the Diagnosis and Management of Patients with Thoracic Aortic Disease. Circulation. 2010; 121: [id number]. Aortic aneurysm NOS (ICD10-I71.9) Electronically Signed: By: Wilkie Lent M.D. On: 10/07/2023 14:56    Assessment & Plan:    Essential hypertension- He has not achieved his BP goal. Will continue the ARB. He agrees to improve his lifestyle modifications. Will recheck his BP in 3 months.     Follow-up: Return in about 3 months (around 05/19/2024).  Debby Molt, MD "
